# Patient Record
Sex: Female | Born: 1990 | Race: Black or African American | Hispanic: No | Marital: Single | Smoking: Former smoker
Health system: Southern US, Community
[De-identification: ages and names within clinical notes are randomized; demographics above are authoritative.]

## PROBLEM LIST (undated history)

## (undated) ENCOUNTER — Inpatient Hospital Stay (HOSPITAL_COMMUNITY): Payer: Self-pay

## (undated) DIAGNOSIS — K219 Gastro-esophageal reflux disease without esophagitis: Secondary | ICD-10-CM

## (undated) DIAGNOSIS — O98819 Other maternal infectious and parasitic diseases complicating pregnancy, unspecified trimester: Secondary | ICD-10-CM

## (undated) DIAGNOSIS — A630 Anogenital (venereal) warts: Secondary | ICD-10-CM

## (undated) DIAGNOSIS — B951 Streptococcus, group B, as the cause of diseases classified elsewhere: Secondary | ICD-10-CM

## (undated) DIAGNOSIS — B999 Unspecified infectious disease: Secondary | ICD-10-CM

## (undated) DIAGNOSIS — M419 Scoliosis, unspecified: Secondary | ICD-10-CM

## (undated) DIAGNOSIS — D649 Anemia, unspecified: Secondary | ICD-10-CM

## (undated) DIAGNOSIS — R87619 Unspecified abnormal cytological findings in specimens from cervix uteri: Secondary | ICD-10-CM

## (undated) DIAGNOSIS — A749 Chlamydial infection, unspecified: Secondary | ICD-10-CM

## (undated) DIAGNOSIS — IMO0002 Reserved for concepts with insufficient information to code with codable children: Secondary | ICD-10-CM

## (undated) HISTORY — PX: NO PAST SURGERIES: SHX2092

## (undated) HISTORY — DX: Anemia, unspecified: D64.9

## (undated) HISTORY — DX: Unspecified infectious disease: B99.9

## (undated) HISTORY — DX: Streptococcus, group b, as the cause of diseases classified elsewhere: B95.1

## (undated) HISTORY — DX: Other maternal infectious and parasitic diseases complicating pregnancy, unspecified trimester: O98.819

---
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
## 2009-07-04 ENCOUNTER — Emergency Department (HOSPITAL_COMMUNITY): Admission: EM | Admit: 2009-07-04 | Discharge: 2009-07-04 | Payer: Self-pay | Admitting: Emergency Medicine

## 2010-06-06 LAB — URINALYSIS, ROUTINE W REFLEX MICROSCOPIC
Bilirubin Urine: NEGATIVE
Glucose, UA: NEGATIVE mg/dL
Hgb urine dipstick: NEGATIVE
Ketones, ur: NEGATIVE mg/dL
Protein, ur: NEGATIVE mg/dL

## 2010-06-06 LAB — URINE CULTURE: Colony Count: 35000

## 2010-06-06 LAB — URINE MICROSCOPIC-ADD ON

## 2010-08-01 NOTE — H&P (Signed)
NAME:  Katherine Hooper, GRILLS NO.:  [id number]   MEDICAL RECORD NO.:  [id number]          PATIENT TYPE:  MAT   LOCATION:  [facility]                          FACILITY:  [facility]   PHYSICIAN:  Lendon Colonel, MD   DATE OF BIRTH:  1990/08/07   DATE OF ADMISSION:  04/02/2007  DATE OF DISCHARGE:                              HISTORY & PHYSICAL   CHIEF COMPLAINT:  Contractions, preterm cervical dilatation.   HISTORY OF PRESENT ILLNESS:  This is a 20 year old G1 at 2 weeks and 3  days who presented to the office today noting contractions for the past  several hours.  A cervical exam performed at the time showed her to be 1  cm dilated, 50% effaced.  She was placed on the tocometer and  contractions were noted.  The patient was sent to MAU for further  evaluation and workup.  In MAU the patient notes improvement in  abdominal pains and contractions after fetal movement.  No leakage of  fluid, no vaginal bleeding  no dysuria, and no change to her chronic  back pain:   PHYSICAL EXAMINATION:  VITAL SIGNS:  She was afebrile with stable vital  signs.  ABDOMEN:  Her abdomen was soft, gravid, nontender with no fundal  tenderness.  EXTREMITIES:  Her lower extremities were nontender with no edema.  GENITOURINARY:  Approximately four hours after her first exam showed her  cervix to be 1 cm dilated, 2.5 cm long, fetal vertex in the -3 position.  Her cervical consistency was soft and the position was medium on the  monitor.  She had rare contractions and the fetal heartbeat was in the  150s with good accelerations, no decelerations, 10 beats of variability.   Ultrasound was performed which showed the baby to be 2160 g with a  cervical length of 2.2.   HOSPITAL COURSE:  The patient received a dose of betamethasone, a single  dose of subcu terbutaline with decrease in the amount of contractions on  the monitor.  A urine culture was sent.  Her GBS sent from the office  was still pending.  At  time of hospital discharge her fetal fibronectin  sent from the office at time of discharge from the hospital was pending.   ASSESSMENT/PLAN:  This is a 20 year old G1 at 33 weeks and 3 days which  threatens preterm labor, minimal contractions and no change in cervical  exam over a 4-hour period.  The patient is deemed low risk for preterm  labor; however, given her gestational age, we will continue with the  second dose of betamethasone.  Tomorrow the patient is instructed on  modified bedrest for being up to two days with resumption of normal  activity.  Should the FFN come back positive we will bring the patient  back to the office tomorrow morning for repeat cervical exam and further  reassurance of low risk of threatened preterm labor.  The patient was  given labor precautions and then discharged home.      Lendon Colonel, MD  Electronically Signed     KAF/MEDQ  D:  04/02/2007  T:  04/03/2007  Job:  [id number]

## 2010-08-25 ENCOUNTER — Inpatient Hospital Stay (INDEPENDENT_AMBULATORY_CARE_PROVIDER_SITE_OTHER)
Admission: RE | Admit: 2010-08-25 | Discharge: 2010-08-25 | Disposition: A | Discharge status: Home / Self Care | Payer: Medicaid Other | Source: Ambulatory Visit | Attending: Emergency Medicine | Admitting: Emergency Medicine

## 2010-08-25 DIAGNOSIS — N912 Amenorrhea, unspecified: Secondary | ICD-10-CM

## 2010-08-25 LAB — POCT URINALYSIS DIP (DEVICE)
Bilirubin Urine: NEGATIVE
Hgb urine dipstick: NEGATIVE
Nitrite: NEGATIVE
pH: 6 (ref 5.0–8.0)

## 2010-12-07 LAB — URINALYSIS, ROUTINE W REFLEX MICROSCOPIC
Glucose, UA: NEGATIVE
Hgb urine dipstick: NEGATIVE
Protein, ur: NEGATIVE

## 2010-12-07 LAB — URINE CULTURE: Culture: NO GROWTH

## 2010-12-08 LAB — CBC
Platelets: 138 — ABNORMAL LOW
RBC: 3.64 — ABNORMAL LOW
RDW: 13.8
WBC: 12.4

## 2010-12-08 LAB — RPR: RPR Ser Ql: NONREACTIVE

## 2011-04-20 ENCOUNTER — Inpatient Hospital Stay (HOSPITAL_COMMUNITY)
Admission: AD | Admit: 2011-04-20 | Discharge: 2011-04-21 | Disposition: A | Discharge status: Home / Self Care | Payer: Medicaid Other | Source: Ambulatory Visit | Attending: Obstetrics and Gynecology | Admitting: Obstetrics and Gynecology

## 2011-04-20 DIAGNOSIS — R109 Unspecified abdominal pain: Secondary | ICD-10-CM | POA: Insufficient documentation

## 2011-04-20 DIAGNOSIS — N946 Dysmenorrhea, unspecified: Secondary | ICD-10-CM | POA: Insufficient documentation

## 2011-04-20 LAB — URINE MICROSCOPIC-ADD ON

## 2011-04-20 LAB — CBC
HCT: 39.5 % (ref 36.0–46.0)
Hemoglobin: 13.5 g/dL (ref 12.0–15.0)
RDW: 12.5 % (ref 11.5–15.5)
WBC: 4.8 10*3/uL (ref 4.0–10.5)

## 2011-04-20 LAB — ABO/RH: ABO/RH(D): B POS

## 2011-04-20 LAB — URINALYSIS, ROUTINE W REFLEX MICROSCOPIC
Glucose, UA: NEGATIVE mg/dL
Leukocytes, UA: NEGATIVE
pH: 6.5 (ref 5.0–8.0)

## 2011-04-20 LAB — WET PREP, GENITAL: Trich, Wet Prep: NONE SEEN

## 2011-04-20 NOTE — Progress Notes (Signed)
Pt states, " I have missed my period, and I started spotting yesterday and I have an occasional sharp pain in my right lower abdomen and I have a heavy feeling. Two weeks ago I had sharp pain in my right lower abdomen with sex."

## 2011-04-20 NOTE — ED Provider Notes (Signed)
History     Chief Complaint  Patient presents with  . Vaginal Bleeding  . Abdominal Cramping   HPI Katherine Hooper 21 y.o. LMP 03-12-11.  Having lower abdominal pain, began having bleeding yesterday.  Having some nausea and thinks she may be pregnant.  MAU urine pregnancy is negative.  Client reports the same thing happened when she was pregnant with her son and requests additional testing beyond urine pregnancy test.   OB History    No data available      No past medical history on file.  No past surgical history on file.  No family history on file.  History  Substance Use Topics  . Smoking status: Not on file  . Smokeless tobacco: Not on file  . Alcohol Use: Not on file    Allergies:  Allergies  Allergen Reactions  . Penicillins Hives    No prescriptions prior to admission    Review of Systems  Constitutional: Negative for fever.  Gastrointestinal: Positive for nausea and abdominal pain. Negative for vomiting.  Genitourinary:       Vaginal bleeding   Physical Exam   Blood pressure 109/63, pulse 90, temperature 98.8 F (37.1 C), temperature source Oral, resp. rate 20, last menstrual period 03/12/2011, SpO2 98.00%.  Physical Exam  Nursing note and vitals reviewed. Constitutional: She is oriented to person, place, and time. She appears well-developed and well-nourished.  HENT:  Head: Normocephalic.  Eyes: EOM are normal.  Neck: Neck supple.  GI: Soft. There is no tenderness. There is no rebound and no guarding.  Genitourinary:       Speculum exam: Vagina - mod amount of blood in vagina Cervix - No contact bleeding Bimanual exam: Cervix closed Uterus mildly tender with palpation, normal size Adnexa non tender, no masses bilaterally GC/Chlam, wet prep done Chaperone present for exam.  Musculoskeletal: Normal range of motion.  Neurological: She is alert and oriented to person, place, and time.  Skin: Skin is warm and dry.  Psychiatric: She has a  normal mood and affect.    MAU Course  Procedures  MDM Results for orders placed during the hospital encounter of 04/20/11 (from the past 24 hour(s))  URINALYSIS, ROUTINE W REFLEX MICROSCOPIC     Status: Abnormal   Collection Time   04/20/11  8:38 PM      Component Value Range   Color, Urine YELLOW  YELLOW    APPearance CLEAR  CLEAR    Specific Gravity, Urine 1.015  1.005 - 1.030    pH 6.5  5.0 - 8.0    Glucose, UA NEGATIVE  NEGATIVE (mg/dL)   Hgb urine dipstick LARGE (*) NEGATIVE    Bilirubin Urine NEGATIVE  NEGATIVE    Ketones, ur NEGATIVE  NEGATIVE (mg/dL)   Protein, ur NEGATIVE  NEGATIVE (mg/dL)   Urobilinogen, UA 0.2  0.0 - 1.0 (mg/dL)   Nitrite NEGATIVE  NEGATIVE    Leukocytes, UA NEGATIVE  NEGATIVE   URINE MICROSCOPIC-ADD ON     Status: Abnormal   Collection Time   04/20/11  8:38 PM      Component Value Range   Squamous Epithelial / LPF FEW (*) RARE    RBC / HPF TOO NUMEROUS TO COUNT  <3 (RBC/hpf)   Bacteria, UA RARE  RARE    Urine-Other MUCOUS PRESENT    WET PREP, GENITAL     Status: Abnormal   Collection Time   04/20/11 11:26 PM      Component Value Range  Yeast Wet Prep HPF POC NONE SEEN  NONE SEEN    Trich, Wet Prep NONE SEEN  NONE SEEN    Clue Cells Wet Prep HPF POC NONE SEEN  NONE SEEN    WBC, Wet Prep HPF POC FEW (*) NONE SEEN   HCG, QUANTITATIVE, PREGNANCY     Status: Normal   Collection Time   04/20/11 11:32 PM      Component Value Range   hCG, Beta Chain, Quant, S <1  <5 (mIU/mL)  CBC     Status: Normal   Collection Time   04/20/11 11:32 PM      Component Value Range   WBC 4.8  4.0 - 10.5 (K/uL)   RBC 4.45  3.87 - 5.11 (MIL/uL)   Hemoglobin 13.5  12.0 - 15.0 (g/dL)   HCT 91.4  78.2 - 95.6 (%)   MCV 88.8  78.0 - 100.0 (fL)   MCH 30.3  26.0 - 34.0 (pg)   MCHC 34.2  30.0 - 36.0 (g/dL)   RDW 21.3  08.6 - 57.8 (%)   Platelets 158  150 - 400 (K/uL)  ABO/RH     Status: Normal   Collection Time   04/20/11 11:32 PM      Component Value Range   ABO/RH(D) B  POS       Assessment and Plan  Dysmenorrhea  Plan Discussed with client that she is not pregnant. Condoms for all intercourse until you want to be pregnant. Ibuprofen 600 mg po for pain [facility]   Valery Chance 04/20/2011, 11:27 PM   Nolene Bernheim, NP 04/21/11 [id number]

## 2011-04-20 NOTE — Progress Notes (Signed)
"  I started having abd cramping and spotting yesterday.  I've never had cramping like this before.  I have more of a sharp pain on the RT side.  It is more pressure than anything.  I feel nauseated, but nothing ever comes up."

## 2011-04-21 LAB — HCG, QUANTITATIVE, PREGNANCY: hCG, Beta Chain, Quant, S: <1 m[IU]/mL (ref <5)

## 2011-04-21 MED ORDER — KETOROLAC TROMETHAMINE 60 MG/2ML IM SOLN
60.0000 mg | Freq: Once | INTRAMUSCULAR | Status: AC
  Administered 2011-04-21: 60 mg via INTRAMUSCULAR
  Filled 2011-04-21: qty 2

## 2011-04-21 MED ORDER — IBUPROFEN 600 MG PO TABS: 600.0000 mg | ORAL_TABLET | Freq: Four times a day (QID) | ORAL | Status: AC | PRN

## 2011-04-23 LAB — POCT PREGNANCY, URINE: Preg Test, Ur: NEGATIVE

## 2011-04-24 NOTE — ED Provider Notes (Signed)
Attestation of Attending Supervision of Advanced Practitioner: Evaluation and management procedures were performed by the PA/NP/CNM/OB Fellow under my supervision/collaboration. Chart reviewed and agree with management and plan.  Ilaria Much V 04/24/2011 6:54 PM    

## 2011-09-06 ENCOUNTER — Inpatient Hospital Stay (HOSPITAL_COMMUNITY)
Admission: AD | Admit: 2011-09-06 | Discharge: 2011-09-06 | Disposition: A | Discharge status: Home / Self Care | Payer: Self-pay | Source: Ambulatory Visit | Attending: Obstetrics & Gynecology | Admitting: Obstetrics & Gynecology

## 2011-09-06 ENCOUNTER — Encounter (HOSPITAL_COMMUNITY): Payer: Self-pay | Admitting: *Deleted

## 2011-09-06 DIAGNOSIS — Z3201 Encounter for pregnancy test, result positive: Secondary | ICD-10-CM | POA: Insufficient documentation

## 2011-09-06 DIAGNOSIS — Z3480 Encounter for supervision of other normal pregnancy, unspecified trimester: Secondary | ICD-10-CM

## 2011-09-06 HISTORY — DX: Chlamydial infection, unspecified: A74.9

## 2011-09-06 HISTORY — DX: Scoliosis, unspecified: M41.9

## 2011-09-06 MED ORDER — CONCEPT OB 130-92.4-1 MG PO CAPS: 1.0000 | ORAL_CAPSULE | ORAL | Status: DC

## 2011-09-06 NOTE — MAU Note (Signed)
Breasts really tender.  No period for this month.  Did a home test, needs confirmation.

## 2011-09-06 NOTE — MAU Provider Note (Signed)
Katherine Hooper y.o.G2P1001 @[redacted]w[redacted]d  by LMP No chief complaint on file.    First Provider Initiated Contact with Patient 09/06/11 1228      SUBJECTIVE  HPI: Presents for pregnancy verification. HPT positive. No pain or bleeding.  Past Medical History  Diagnosis Date  . Scoliosis   . Chlamydia    Past Surgical History  Procedure Date  . No past surgeries    History   Social History  . Marital Status: Single    Spouse Name: N/A    Number of Children: N/A  . Years of Education: N/A   Occupational History  . Not on file.   Social History Main Topics  . Smoking status: Current Everyday Smoker -- 0.2 packs/day for 10 years    Types: Cigarettes  . Smokeless tobacco: Never Used  . Alcohol Use: No  . Drug Use: No  . Sexually Active: Yes   Other Topics Concern  . Not on file   Social History Narrative  . No narrative on file   No current facility-administered medications on file prior to encounter.   No current outpatient prescriptions on file prior to encounter.   Allergies  Allergen Reactions  . Penicillins Hives    ROS: Pertinent items in HPI  OBJECTIVE Blood pressure 114/62, pulse 90, temperature 98.2 F (36.8 C), temperature source Oral, resp. rate 20, height 5' 1.5" (1.562 m), weight 80.74 kg (178 lb), last menstrual period 07/26/2011.  GENERAL: Well-developed, well-nourished female in no acute distress.  HEENT: Normocephalic, good dentition HEART: normal rate RESP: normal effort ABDOMEN: Soft, nontender EXTREMITIES: Nontender, no edema NEURO: Alert and oriented  LAB RESULTS  Results for orders placed during the hospital encounter of 09/06/11 (from the past 24 hour(s))  POCT PREGNANCY, URINE     Status: Abnormal   Collection Time   09/06/11 11:53 AM      Component Value Range   Preg Test, Ur POSITIVE (*) NEGATIVE       ASSESSMENT G2P1001 at 6wk pregnancy  PLAN Rx PNVs, preg verification letter, list of providers, precautions. Will  start [facility] asap     Katherine Hooper 09/06/2011 12:30 PM

## 2011-09-06 NOTE — Discharge Instructions (Signed)
ABCs of Pregnancy A Antepartum care is very important. Be sure you see your doctor and get prenatal care as soon as you think you are pregnant. At this time, you will be tested for infection, genetic abnormalities and potential problems with you and the pregnancy. This is the time to discuss diet, exercise, work, medications, labor, pain medication during labor and the possibility of a cesarean delivery. Ask any questions that may concern you. It is important to see your doctor regularly throughout your pregnancy. Avoid exposure to toxic substances and chemicals - such as cleaning solvents, lead and mercury, some insecticides, and paint. Pregnant women should avoid exposure to paint fumes, and fumes that cause you to feel ill, dizzy or faint. When possible, it is a good idea to have a pre-pregnancy consultation with your caregiver to begin some important recommendations your caregiver suggests such as, taking folic acid, exercising, quitting smoking, avoiding alcoholic beverages, etc. B Breastfeeding is the healthiest choice for both you and your baby. It has many nutritional benefits for the baby and health benefits for the mother. It also creates a very tight and loving bond between the baby and mother. Talk to your doctor, your family and friends, and your employer about how you choose to feed your baby and how they can support you in your decision. Not all birth defects can be prevented, but a woman can take actions that may increase her chance of having a healthy baby. Many birth defects happen very early in pregnancy, sometimes before a woman even knows she is pregnant. Birth defects or abnormalities of any child in your or the father's family should be discussed with your caregiver. Get a good support bra as your breast size changes. Wear it especially when you exercise and when nursing.  C Celebrate the news of your pregnancy with the your spouse/father and family. Childbirth classes are helpful to  take for you and the spouse/father because it helps to understand what happens during the pregnancy, labor and delivery. Cesarean delivery should be discussed with your doctor so you are prepared for that possibility. The pros and cons of circumcision if it is a boy, should be discussed with your pediatrician. Cigarette smoking during pregnancy can result in low birth weight babies. It has been associated with infertility, miscarriages, tubal pregnancies, infant death (mortality) and poor health (morbidity) in childhood. Additionally, cigarette smoking may cause long-term learning disabilities. If you smoke, you should try to quit before getting pregnant and not smoke during the pregnancy. Secondary smoke may also harm a mother and her developing baby. It is a good idea to ask people to stop smoking around you during your pregnancy and after the baby is born. Extra calcium is necessary when you are pregnant and is found in your prenatal vitamin, in dairy products, green leafy vegetables and in calcium supplements. D A healthy diet according to your current weight and height, along with vitamins and mineral supplements should be discussed with your caregiver. Domestic abuse or violence should be made known to your doctor right away to get the situation corrected. Drink more water when you exercise to keep hydrated. Discomfort of your back and legs usually develops and progresses from the middle of the second trimester through to delivery of the baby. This is because of the enlarging baby and uterus, which may also affect your balance. Do not take illegal drugs. Illegal drugs can seriously harm the baby and you. Drink extra fluids (water is best) throughout pregnancy to help   your body keep up with the increases in your blood volume. Drink at least 6 to 8 glasses of water, fruit juice, or milk each day. A good way to know you are drinking enough fluid is when your urine looks almost like clear water or is very light  yellow.  E Eat healthy to get the nutrients you and your unborn baby need. Your meals should include the five basic food groups. Exercise (30 minutes of light to moderate exercise a day) is important and encouraged during pregnancy, if there are no medical problems or problems with the pregnancy. Exercise that causes discomfort or dizziness should be stopped and reported to your caregiver. Emotions during pregnancy can change from being ecstatic to depression and should be understood by you, your partner and your family. F Fetal screening with ultrasound, amniocentesis and monitoring during pregnancy and labor is common and sometimes necessary. Take 400 micrograms of folic acid daily both before, when possible, and during the first few months of pregnancy to reduce the risk of birth defects of the brain and spine. All women who could possibly become pregnant should take a vitamin with folic acid, every day. It is also important to eat a healthy diet with fortified foods (enriched grain products, including cereals, rice, breads, and pastas) and foods with natural sources of folate (orange juice, green leafy vegetables, beans, peanuts, broccoli, asparagus, peas, and lentils). The father should be involved with all aspects of the pregnancy including, the prenatal care, childbirth classes, labor, delivery, and postpartum time. Fathers may also have emotional concerns about being a father, financial needs, and raising a family. G Genetic testing should be done appropriately. It is important to know your family and the father's history. If there have been problems with pregnancies or birth defects in your family, report these to your doctor. Also, genetic counselors can talk with you about the information you might need in making decisions about having a family. You can call a major medical center in your area for help in finding a board-certified genetic counselor. Genetic testing and counseling should be done  before pregnancy when possible, especially if there is a history of problems in the mother's or father's family. Certain ethnic backgrounds are more at risk for genetic defects. H Get familiar with the hospital where you will be having your baby. Get to know how long it takes to get there, the labor and delivery area, and the hospital procedures. Be sure your medical insurance is accepted there. Get your home ready for the baby including, clothes, the baby's room (when possible), furniture and car seat. Hand washing is important throughout the day, especially after handling raw meat and poultry, changing the baby's diaper or using the bathroom. This can help prevent the spread of many bacteria and viruses that cause infection. Your hair may become dry and thinner, but will return to normal a few weeks after the baby is born. Heartburn is a common problem that can be treated by taking antacids recommended by your caregiver, eating smaller meals 5 or 6 times a day, not drinking liquids when eating, drinking between meals and raising the head of your bed 2 to 3 inches. I Insurance to cover you, the baby, doctor and hospital should be reviewed so that you will be prepared to pay any costs not covered by your insurance plan. If you do not have medical insurance, there are usually clinics and services available for you in your community. Take 30 milligrams of iron during   your pregnancy as prescribed by your doctor to reduce the risk of low red blood cells (anemia) later in pregnancy. All women of childbearing age should eat a diet rich in iron. J There should be a joint effort for the mother, father and any other children to adapt to the pregnancy financially, emotionally, and psychologically during the pregnancy. Join a support group for moms-to-be. Or, join a class on parenting or childbirth. Have the family participate when possible. K Know your limits. Let your caregiver know if you experience any of the  following:   Pain of any kind.   Strong cramps.   You develop a lot of weight in a short period of time (5 pounds in 3 to 5 days).   Vaginal bleeding, leaking of amniotic fluid.   Headache, vision problems.   Dizziness, fainting, shortness of breath.   Chest pain.   Fever of 102 F (38.9 C) or higher.   Gush of clear fluid from your vagina.   Painful urination.   Domestic violence.   Irregular heartbeat (palpitations).   Rapid beating of the heart (tachycardia).   Constant feeling sick to your stomach (nauseous) and vomiting.   Trouble walking, fluid retention (edema).   Muscle weakness.   If your baby has decreased activity.   Persistent diarrhea.   Abnormal vaginal discharge.   Uterine contractions at 20-minute intervals.   Back pain that travels down your leg.  L Learn and practice that what you eat and drink should be in moderation and healthy for you and your baby. Legal drugs such as alcohol and caffeine are important issues for pregnant women. There is no safe amount of alcohol a woman can drink while pregnant. Fetal alcohol syndrome, a disorder characterized by growth retardation, facial abnormalities, and central nervous system dysfunction, is caused by a woman's use of alcohol during pregnancy. Caffeine, found in tea, coffee, soft drinks and chocolate, should also be limited. Be sure to read labels when trying to cut down on caffeine during pregnancy. More than 200 foods, beverages, and over-the-counter medications contain caffeine and have a high salt content! There are coffees and teas that do not contain caffeine. M Medical conditions such as diabetes, epilepsy, and high blood pressure should be treated and kept under control before pregnancy when possible, but especially during pregnancy. Ask your caregiver about any medications that may need to be changed or adjusted during pregnancy. If you are currently taking any medications, ask your caregiver if it  is safe to take them while you are pregnant or before getting pregnant when possible. Also, be sure to discuss any herbs or vitamins you are taking. They are medicines, too! Discuss with your doctor all medications, prescribed and over-the-counter, that you are taking. During your prenatal visit, discuss the medications your doctor may give you during labor and delivery. N Never be afraid to ask your doctor or caregiver questions about your health, the progress of the pregnancy, family problems, stressful situations, and recommendation for a pediatrician, if you do not have one. It is better to take all precautions and discuss any questions or concerns you may have during your office visits. It is a good idea to write down your questions before you visit the doctor. O Over-the-counter cough and cold remedies may contain alcohol or other ingredients that should be avoided during pregnancy. Ask your caregiver about prescription, herbs or over-the-counter medications that you are taking or may consider taking while pregnant.  P Physical activity during pregnancy can   benefit both you and your baby by lessening discomfort and fatigue, providing a sense of well-being, and increasing the likelihood of early recovery after delivery. Light to moderate exercise during pregnancy strengthens the belly (abdominal) and back muscles. This helps improve posture. Practicing yoga, walking, swimming, and cycling on a stationary bicycle are usually safe exercises for pregnant women. Avoid scuba diving, exercise at high altitudes (over 3000 feet), skiing, horseback riding, contact sports, etc. Always check with your doctor before beginning any kind of exercise, especially during pregnancy and especially if you did not exercise before getting pregnant. Q Queasiness, stomach upset and morning sickness are common during pregnancy. Eating a couple of crackers or dry toast before getting out of bed. Foods that you normally love may  make you feel sick to your stomach. You may need to substitute other nutritious foods. Eating 5 or 6 small meals a day instead of 3 large ones may make you feel better. Do not drink with your meals, drink between meals. Questions that you have should be written down and asked during your prenatal visits. R Read about and make plans to baby-proof your home. There are important tips for making your home a safer environment for your baby. Review the tips and make your home safer for you and your baby. Read food labels regarding calories, salt and fat content in the food. S Saunas, hot tubs, and steam rooms should be avoided while you are pregnant. Excessive high heat may be harmful during your pregnancy. Your caregiver will screen and examine you for sexually transmitted diseases and genetic disorders during your prenatal visits. Learn the signs of labor. Sexual relations while pregnant is safe unless there is a medical or pregnancy problem and your caregiver advises against it. T Traveling long distances should be avoided especially in the third trimester of your pregnancy. If you do have to travel out of state, be sure to take a copy of your medical records and medical insurance plan with you. You should not travel long distances without seeing your doctor first. Most airlines will not allow you to travel after 36 weeks of pregnancy. Toxoplasmosis is an infection caused by a parasite that can seriously harm an unborn baby. Avoid eating undercooked meat and handling cat litter. Be sure to wear gloves when gardening. Tingling of the hands and fingers is not unusual and is due to fluid retention. This will go away after the baby is born. U Womb (uterus) size increases during the first trimester. Your kidneys will begin to function more efficiently. This may cause you to feel the need to urinate more often. You may also leak urine when sneezing, coughing or laughing. This is due to the growing uterus pressing  against your bladder, which lies directly in front of and slightly under the uterus during the first few months of pregnancy. If you experience burning along with frequency of urination or bloody urine, be sure to tell your doctor. The size of your uterus in the third trimester may cause a problem with your balance. It is advisable to maintain good posture and avoid wearing high heels during this time. An ultrasound of your baby may be necessary during your pregnancy and is safe for you and your baby. V Vaccinations are an important concern for pregnant women. Get needed vaccines before pregnancy. Center for Disease Control (www.cdc.gov) has clear guidelines for the use of vaccines during pregnancy. Review the list, be sure to discuss it with your doctor. Prenatal vitamins are helpful   and healthy for you and the baby. Do not take extra vitamins except what is recommended. Taking too much of certain vitamins can cause overdose problems. Continuous vomiting should be reported to your caregiver. Varicose veins may appear especially if there is a family history of varicose veins. They should subside after the delivery of the baby. Support hose helps if there is leg discomfort. W Being overweight or underweight during pregnancy may cause problems. Try to get within 15 pounds of your ideal weight before pregnancy. Remember, pregnancy is not a time to be dieting! Do not stop eating or start skipping meals as your weight increases. Both you and your baby need the calories and nutrition you receive from a healthy diet. Be sure to consult with your doctor about your diet. There is a formula and diet plan available depending on whether you are overweight or underweight. Your caregiver or nutritionist can help and advise you if necessary. X Avoid X-rays. If you must have dental work or diagnostic tests, tell your dentist or physician that you are pregnant so that extra care can be taken. X-rays should only be taken when  the risks of not taking them outweigh the risk of taking them. If needed, only the minimum amount of radiation should be used. When X-rays are necessary, protective lead shields should be used to cover areas of the body that are not being X-rayed. Y Your baby loves you. Breastfeeding your baby creates a loving and very close bond between the two of you. Give your baby a healthy environment to live in while you are pregnant. Infants and children require constant care and guidance. Their health and safety should be carefully watched at all times. After the baby is born, rest or take a nap when the baby is sleeping. Z Get your ZZZs. Be sure to get plenty of rest. Resting on your side as often as possible, especially on your left side is advised. It provides the best circulation to your baby and helps reduce swelling. Try taking a nap for 30 to 45 minutes in the afternoon when possible. After the baby is born rest or take a nap when the baby is sleeping. Try elevating your feet for that amount of time when possible. It helps the circulation in your legs and helps reduce swelling.  Most information courtesy of the CDC. Document Released: 03/05/2005 Document Revised: 02/22/2011 Document Reviewed: 11/17/2008 [employer] Patient Information 2012 [employer]. 

## 2011-11-07 ENCOUNTER — Inpatient Hospital Stay (HOSPITAL_COMMUNITY)
Admission: AD | Admit: 2011-11-07 | Discharge: 2011-11-07 | Disposition: A | Discharge status: Home / Self Care | Payer: Medicaid Other | Source: Ambulatory Visit | Attending: Obstetrics & Gynecology | Admitting: Obstetrics & Gynecology

## 2011-11-07 ENCOUNTER — Encounter (HOSPITAL_COMMUNITY): Payer: Self-pay | Admitting: *Deleted

## 2011-11-07 ENCOUNTER — Inpatient Hospital Stay (HOSPITAL_COMMUNITY): Payer: Medicaid Other

## 2011-11-07 DIAGNOSIS — O209 Hemorrhage in early pregnancy, unspecified: Secondary | ICD-10-CM | POA: Insufficient documentation

## 2011-11-07 DIAGNOSIS — N760 Acute vaginitis: Secondary | ICD-10-CM | POA: Insufficient documentation

## 2011-11-07 DIAGNOSIS — B9689 Other specified bacterial agents as the cause of diseases classified elsewhere: Secondary | ICD-10-CM | POA: Insufficient documentation

## 2011-11-07 DIAGNOSIS — R109 Unspecified abdominal pain: Secondary | ICD-10-CM | POA: Insufficient documentation

## 2011-11-07 DIAGNOSIS — A499 Bacterial infection, unspecified: Secondary | ICD-10-CM | POA: Insufficient documentation

## 2011-11-07 DIAGNOSIS — O2390 Unspecified genitourinary tract infection in pregnancy, unspecified trimester: Secondary | ICD-10-CM | POA: Insufficient documentation

## 2011-11-07 LAB — WET PREP, GENITAL: Yeast Wet Prep HPF POC: NONE SEEN

## 2011-11-07 LAB — URINALYSIS, ROUTINE W REFLEX MICROSCOPIC
Bilirubin Urine: NEGATIVE
Nitrite: NEGATIVE
Specific Gravity, Urine: 1.02 (ref 1.005–1.030)
Urobilinogen, UA: 0.2 mg/dL (ref 0.0–1.0)
pH: 8 (ref 5.0–8.0)

## 2011-11-07 LAB — URINE MICROSCOPIC-ADD ON

## 2011-11-07 MED ORDER — METRONIDAZOLE 500 MG PO TABS: 500.0000 mg | ORAL_TABLET | Freq: Two times a day (BID) | ORAL | Status: AC

## 2011-11-07 NOTE — MAU Provider Note (Signed)
History     CSN: [id number]  Arrival date and time: 11/07/11 1206   None     Chief Complaint  Patient presents with  . Abdominal Pain  . Vaginal Bleeding   Abdominal Pain This is a new problem. The current episode started 1 to 4 weeks ago. The onset quality is undetermined. The problem occurs 2 to 4 times per day. The problem has been gradually worsening. The pain is located in the suprapubic region. The quality of the pain is sharp. The abdominal pain does not radiate. Associated symptoms include nausea and vomiting. Pertinent negatives include no constipation, diarrhea, dysuria, fever or frequency. The pain is aggravated by certain positions. The pain is relieved by certain positions. She has tried nothing for the symptoms.  Vaginal Bleeding Associated symptoms include abdominal pain, nausea and vomiting. Pertinent negatives include no chills, constipation, diarrhea, dysuria, fever or frequency.    Pt G2 P1, at 14w 2d, with no prenatal care, states that she has been having lower abd pain on and off for the last few weeks. She states that suddenly got a lot worse last night when she went to stand up from a chair. She is in no pain now. She became worried after seeing brown blood in her underwear this morning. States when she goes to the bathroom there is a little pink blood on the tissue. Denies headache, fever, chills, diarrhea, and vaginal discharge. Has had some nausea and vomiting since pregnancy started.   OB History    Grav Para Term Preterm Abortions TAB SAB Ect Mult Living   2 1 1  0 0 0 0 0 0 1      Past Medical History  Diagnosis Date  . Scoliosis   . Chlamydia     Past Surgical History  Procedure Date  . No past surgeries     Family History  Problem Relation Age of Onset  . Other Neg Hx     History  Substance Use Topics  . Smoking status: Former Smoker -- 0.2 packs/day for 10 years    Types: Cigarettes    Quit date: 09/17/2011  . Smokeless tobacco: Never  Used  . Alcohol Use: No    Allergies:  Allergies  Allergen Reactions  . Penicillins Hives    Prescriptions prior to admission  Medication Sig Dispense Refill  . Prenat w/o A Vit-FeFum-FePo-FA (CONCEPT OB) 130-92.4-1 MG CAPS Take 1 tablet by mouth 1 day or 1 dose.  30 capsule  5    Review of Systems  Constitutional: Negative for fever and chills.  Gastrointestinal: Positive for nausea, vomiting and abdominal pain. Negative for diarrhea and constipation.       Has had nausea and vomiting since pregnancy started. Is unchanged today  Genitourinary: Positive for vaginal bleeding. Negative for dysuria and frequency.   Physical Exam   Blood pressure 82/70, pulse 93, temperature 98.3 F (36.8 C), temperature source Oral, resp. rate 16, height 5' 1.5" (1.562 m), weight 83.915 kg (185 lb), last menstrual period 07/26/2011, SpO2 98.00%.  Physical Exam  Constitutional: She is oriented to person, place, and time. She appears well-developed and well-nourished. No distress.  HENT:  Head: Normocephalic and atraumatic.  Eyes: EOM are normal.  Cardiovascular: Normal rate, regular rhythm and normal heart sounds.   Respiratory: Effort normal.  GI: Soft. She exhibits no distension. There is tenderness. There is no rebound and no guarding.       Mild suprapubic tenderness on exam  Genitourinary: Vaginal discharge found.  There is a moderate amount of bloody discharge in the vaginal vault and vagina. Cervix is closed. No cervical motion tenderness. No adnexal tenderness.   Neurological: She is alert and oriented to person, place, and time.  Skin: Skin is warm and dry.    MAU Course  Procedures  Results for orders placed during the hospital encounter of 11/07/11 (from the past 24 hour(s))  URINALYSIS, ROUTINE W REFLEX MICROSCOPIC     Status: Abnormal   Collection Time   11/07/11 12:22 PM      Component Value Range   Color, Urine YELLOW  YELLOW   APPearance CLEAR  CLEAR   Specific  Gravity, Urine 1.020  1.005 - 1.030   pH 8.0  5.0 - 8.0   Glucose, UA NEGATIVE  NEGATIVE mg/dL   Hgb urine dipstick MODERATE (*) NEGATIVE   Bilirubin Urine NEGATIVE  NEGATIVE   Ketones, ur NEGATIVE  NEGATIVE mg/dL   Protein, ur NEGATIVE  NEGATIVE mg/dL   Urobilinogen, UA 0.2  0.0 - 1.0 mg/dL   Nitrite NEGATIVE  NEGATIVE   Leukocytes, UA NEGATIVE  NEGATIVE  URINE MICROSCOPIC-ADD ON     Status: Abnormal   Collection Time   11/07/11 12:22 PM      Component Value Range   Squamous Epithelial / LPF FEW (*) RARE   RBC / HPF 0-2  <3 RBC/hpf  WET PREP, GENITAL     Status: Abnormal   Collection Time   11/07/11  2:35 PM      Component Value Range   Yeast Wet Prep HPF POC NONE SEEN  NONE SEEN   Trich, Wet Prep NONE SEEN  NONE SEEN   Clue Cells Wet Prep HPF POC MODERATE (*) NONE SEEN   WBC, Wet Prep HPF POC FEW (*) NONE SEEN     Assessment and Plan  Abd pain and bleeding in the 1st trimester- pain is most likely due to round ligament pain. Urinalysis was wnl. Will get C&G, wet prep, and ultrasound.  Ultrasound was wnl. Wet prep showed bacterial vaginosis- will treat with flagyl. C&G pending.  Final DX: Round ligament pain and first trimester bleeding  Pt encouraged to seek prenatal care. States understanding.  Aniceto Boss, MA, PA-S2  FORTH, VICTORIA 11/07/2011, 1:02 PM   15:47  Call from [place], [place], she and the resident are in a delivery.  All labs back and patient may be discharged.  Asked that I discharge patient.

## 2011-11-07 NOTE — MAU Note (Signed)
Patient states she started having lower abdominal pain last night. Today had a dark red/brownish discharge with wiping. Intercourse last 2 days ago.

## 2011-11-10 NOTE — MAU Provider Note (Signed)
I examined pt and agree with documentation above and resident plan of care, with the exception of diagnosis changed to vaginal bleeding in 2nd trimester. [facility]

## 2011-12-30 ENCOUNTER — Inpatient Hospital Stay (HOSPITAL_COMMUNITY): Payer: Medicaid Other

## 2011-12-30 ENCOUNTER — Encounter (HOSPITAL_COMMUNITY): Payer: Self-pay | Admitting: Obstetrics and Gynecology

## 2011-12-30 ENCOUNTER — Inpatient Hospital Stay (HOSPITAL_COMMUNITY)
Admission: AD | Admit: 2011-12-30 | Discharge: 2011-12-30 | Disposition: A | Discharge status: Home / Self Care | Payer: Medicaid Other | Source: Ambulatory Visit | Attending: Obstetrics and Gynecology | Admitting: Obstetrics and Gynecology

## 2011-12-30 DIAGNOSIS — A499 Bacterial infection, unspecified: Secondary | ICD-10-CM

## 2011-12-30 DIAGNOSIS — O4692 Antepartum hemorrhage, unspecified, second trimester: Secondary | ICD-10-CM

## 2011-12-30 DIAGNOSIS — R109 Unspecified abdominal pain: Secondary | ICD-10-CM | POA: Insufficient documentation

## 2011-12-30 DIAGNOSIS — B9689 Other specified bacterial agents as the cause of diseases classified elsewhere: Secondary | ICD-10-CM | POA: Insufficient documentation

## 2011-12-30 DIAGNOSIS — N760 Acute vaginitis: Secondary | ICD-10-CM | POA: Insufficient documentation

## 2011-12-30 DIAGNOSIS — O209 Hemorrhage in early pregnancy, unspecified: Secondary | ICD-10-CM | POA: Insufficient documentation

## 2011-12-30 DIAGNOSIS — O2390 Unspecified genitourinary tract infection in pregnancy, unspecified trimester: Secondary | ICD-10-CM | POA: Insufficient documentation

## 2011-12-30 LAB — URINALYSIS, ROUTINE W REFLEX MICROSCOPIC
Glucose, UA: NEGATIVE mg/dL
Hgb urine dipstick: NEGATIVE
Leukocytes, UA: NEGATIVE
pH: 6.5 (ref 5.0–8.0)

## 2011-12-30 LAB — WET PREP, GENITAL: Yeast Wet Prep HPF POC: NONE SEEN

## 2011-12-30 MED ORDER — METRONIDAZOLE 500 MG PO TABS: 500.0000 mg | ORAL_TABLET | Freq: Two times a day (BID) | ORAL | Status: DC

## 2011-12-30 NOTE — MAU Note (Signed)
Patient states she is having lower abdominal sharp pain and spotting.

## 2011-12-30 NOTE — MAU Provider Note (Signed)
Chief Complaint:  Abdominal Pain and Vaginal Bleeding   First Provider Initiated Contact with Patient 12/30/11 0910      HPI: Katherine Hooper is a 21 y.o. G2P1001 at 21w3dwho presents to maternity admissions reporting low abd cramping, LBP and spotting since last night. Bleeding has stopped, but cramping has worsened. This morning. Denies fever, chills, urinary complaints, GI complaints, LOF, vaginal discharge. Good fetal movement. Has not started prenatal care. States she has NOB 01/02/12 at [facility]. One prior episode of spotting at 14 weeks. No previa or abruption identified. Last IC >24 hours ago.   Past Medical History: Past Medical History  Diagnosis Date  . Scoliosis   . Chlamydia     Past obstetric history: OB History    Grav Para Term Preterm Abortions TAB SAB Ect Mult Living   2 1 1  0 0 0 0 0 0 1     # Outc Date GA Lbr Len/2nd Wgt Sex Del Anes PTL Lv   1 TRM      SVD EPI No    2 CUR               Past Surgical History: Past Surgical History  Procedure Date  . No past surgeries     Family History: Family History  Problem Relation Age of Onset  . Other Neg Hx     Social History: History  Substance Use Topics  . Smoking status: Former Smoker -- 0.2 packs/day for 10 years    Types: Cigarettes    Quit date: 09/17/2011  . Smokeless tobacco: Never Used  . Alcohol Use: No    Allergies:  Allergies  Allergen Reactions  . Penicillins Hives    Meds:  No prescriptions prior to admission    ROS: Pertinent findings in history of present illness.  Physical Exam  Blood pressure 118/62, pulse 92, temperature 97.3 F (36.3 C), temperature source Oral, resp. rate 18, height 5\' 2"  (1.575 m), weight 87.454 kg (192 lb 12.8 oz), last menstrual period 07/26/2011. GENERAL: Well-developed, well-nourished female in no acute distress.  HEENT: normocephalic HEART: normal rate RESP: normal effort ABDOMEN: Soft, non-tender, gravid appropriate for gestational  age EXTREMITIES: Nontender, no edema NEURO: alert and oriented SPECULUM EXAM: NEFG, moderate amount of thick, white, odorless discharge, no blood, cervix clean Dilation: FT ext os, closed internal os. Effacement (%): Thick Cervical Position: Posterior Presentation: Undeterminable Exam by:: Ivonne Andrew CNM  FHT:  [id number]   Labs: Results for orders placed during the hospital encounter of 12/30/11 (from the past 24 hour(s))  URINALYSIS, ROUTINE W REFLEX MICROSCOPIC     Status: Abnormal   Collection Time   12/30/11  7:30 AM      Component Value Range   Color, Urine YELLOW  YELLOW   APPearance HAZY (*) CLEAR   Specific Gravity, Urine 1.025  1.005 - 1.030   pH 6.5  5.0 - 8.0   Glucose, UA NEGATIVE  NEGATIVE mg/dL   Hgb urine dipstick NEGATIVE  NEGATIVE   Bilirubin Urine NEGATIVE  NEGATIVE   Ketones, ur NEGATIVE  NEGATIVE mg/dL   Protein, ur NEGATIVE  NEGATIVE mg/dL   Urobilinogen, UA 0.2  0.0 - 1.0 mg/dL   Nitrite NEGATIVE  NEGATIVE   Leukocytes, UA NEGATIVE  NEGATIVE  WET PREP, GENITAL     Status: Abnormal   Collection Time   12/30/11  9:20 AM      Component Value Range   Yeast Wet Prep HPF POC NONE SEEN  NONE SEEN  Trich, Wet Prep NONE SEEN  NONE SEEN   Clue Cells Wet Prep HPF POC MODERATE (*) NONE SEEN   WBC, Wet Prep HPF POC FEW (*) NONE SEEN  FETAL FIBRONECTIN     Status: Normal   Collection Time   12/30/11  9:20 AM      Component Value Range   Fetal Fibronectin NEGATIVE  NEGATIVE    Imaging:   ED Course   Assessment: 1. BV (bacterial vaginosis)   2. Second trimester bleeding    Plan: Discharge home Preterm labor precautions and fetal kick counts.  Bleeding precautions Pelvic rest x 1 week.     Follow-up Information    Follow up with [facility]. On 01/02/2012.   Contact information:   [address] [place] [address] [phone]      Follow up with [facility] [place]  MATERNITY ADMISSIONS. (As needed if symptoms worsen)    Contact information:   [address] [id number] mc [place] [address] [phone]          Medication List     As of 12/30/2011  9:33 PM    TAKE these medications         acetaminophen 325 MG tablet   Commonly known as: TYLENOL   Take 650 mg by mouth daily as needed. For pain      metroNIDAZOLE 500 MG tablet   Commonly known as: FLAGYL   Take 1 tablet (500 mg total) by mouth 2 (two) times daily.      prenatal multivitamin Tabs   Take 1 tablet by mouth daily.         [place], CNM 12/30/2011 5:40 PM

## 2011-12-30 NOTE — MAU Note (Signed)
Pt presents to MAU with chief complaint of vaginal spotting and lower abdominal pain. Pt says she felt a sharp pain in her lower abdomen this morning and when she went to the bathroom she noticed dark brown blood. Pt is a G2P1 @ [redacted]w[redacted]d.

## 2011-12-31 LAB — GC/CHLAMYDIA PROBE AMP, GENITAL: GC Probe Amp, Genital: NEGATIVE

## 2012-01-01 NOTE — MAU Provider Note (Signed)
Attestation of Attending Supervision of Advanced Practitioner: Evaluation and management procedures were performed by the PA/NP/CNM/OB Fellow under my supervision/collaboration. Chart reviewed and agree with management and plan.  Mina Babula V 01/01/2012 8:06 AM    

## 2012-01-02 ENCOUNTER — Ambulatory Visit (INDEPENDENT_AMBULATORY_CARE_PROVIDER_SITE_OTHER): Payer: Medicaid Other | Admitting: Obstetrics and Gynecology

## 2012-01-02 DIAGNOSIS — Z331 Pregnant state, incidental: Secondary | ICD-10-CM

## 2012-01-02 LAB — OB RESULTS CONSOLE GBS: GBS: POSITIVE

## 2012-01-03 ENCOUNTER — Other Ambulatory Visit: Payer: Self-pay | Admitting: Obstetrics and Gynecology

## 2012-01-03 DIAGNOSIS — Z3689 Encounter for other specified antenatal screening: Secondary | ICD-10-CM

## 2012-01-03 LAB — PRENATAL PANEL VII
Antibody Screen: NEGATIVE
Basophils Absolute: 0 10*3/uL (ref 0.0–0.1)
Basophils Relative: 0 % (ref 0–1)
Eosinophils Absolute: 0.1 10*3/uL (ref 0.0–0.7)
Eosinophils Relative: 2 % (ref 0–5)
HCT: 36.2 % (ref 36.0–46.0)
MCH: 31 pg (ref 26.0–34.0)
MCHC: 35.4 g/dL (ref 30.0–36.0)
MCV: 87.7 fL (ref 78.0–100.0)
Monocytes Absolute: 0.5 10*3/uL (ref 0.1–1.0)
Platelets: 175 10*3/uL (ref 150–400)
RDW: 12.9 % (ref 11.5–15.5)
Rh Type: POSITIVE

## 2012-01-03 LAB — POCT URINALYSIS DIPSTICK
Bilirubin, UA: 1
Glucose, UA: NEGATIVE
Nitrite, UA: NEGATIVE
Spec Grav, UA: 1.015
Urobilinogen, UA: NEGATIVE

## 2012-01-03 NOTE — Progress Notes (Signed)
NOB interview completed.  Pt is late to care.  Has been seen @ [facility] for vaginal bldg on different occasions.  Is currently on Flagyl for BV dx'd by [facility].  Pt has not had an anatomy scan yet.  Pt scheduled for anatomy on Monday 01/07/12 @ 1030, then NOB work up @ 1130 w/ MK.

## 2012-01-04 LAB — HEMOGLOBINOPATHY EVALUATION
Hgb A: 97.4 % (ref 96.8–97.8)
Hgb F Quant: 0 % (ref 0.0–2.0)

## 2012-01-07 ENCOUNTER — Encounter: Payer: Self-pay | Admitting: Obstetrics and Gynecology

## 2012-01-07 ENCOUNTER — Ambulatory Visit (INDEPENDENT_AMBULATORY_CARE_PROVIDER_SITE_OTHER): Payer: Medicaid Other

## 2012-01-07 ENCOUNTER — Ambulatory Visit (INDEPENDENT_AMBULATORY_CARE_PROVIDER_SITE_OTHER): Payer: Medicaid Other | Admitting: Obstetrics and Gynecology

## 2012-01-07 VITALS — BP 100/54 | Wt 193.0 lb

## 2012-01-07 DIAGNOSIS — N390 Urinary tract infection, site not specified: Secondary | ICD-10-CM

## 2012-01-07 DIAGNOSIS — O2340 Unspecified infection of urinary tract in pregnancy, unspecified trimester: Secondary | ICD-10-CM

## 2012-01-07 DIAGNOSIS — B951 Streptococcus, group B, as the cause of diseases classified elsewhere: Secondary | ICD-10-CM

## 2012-01-07 DIAGNOSIS — Z3689 Encounter for other specified antenatal screening: Secondary | ICD-10-CM

## 2012-01-07 DIAGNOSIS — Z331 Pregnant state, incidental: Secondary | ICD-10-CM

## 2012-01-07 DIAGNOSIS — O2390 Unspecified genitourinary tract infection in pregnancy, unspecified trimester: Secondary | ICD-10-CM

## 2012-01-07 LAB — US OB COMP + 14 WK

## 2012-01-07 LAB — POCT URINALYSIS DIPSTICK
Bilirubin, UA: NEGATIVE
Blood, UA: NEGATIVE
Glucose, UA: NEGATIVE
Spec Grav, UA: 1.02

## 2012-01-07 NOTE — Progress Notes (Signed)
Pt is here today for nob work-up .

## 2012-01-08 ENCOUNTER — Telehealth: Payer: Self-pay | Admitting: Obstetrics and Gynecology

## 2012-01-08 LAB — PAP IG W/ RFLX HPV ASCU

## 2012-01-08 LAB — CULTURE, OB URINE: Colony Count: 30000

## 2012-01-08 NOTE — Telephone Encounter (Signed)
Discussed UA C&S for GBS+ urine result with PCN allergy hives, reaction hives delayed 2-3 days after start of PCN dosing discussed plan for RX Keflex, discussed risk of reaction if rash or hives, short of breath reviewd symptoms call office to ER if sx indicate discussed, f/o 1 week UA C&S, plan tx for GBS during labor. Verbalized understanding. Start Keflex in am so alert and ready to report if symptoms, 8 water daily, verbalized understanding. Discussion with Dr. Stefano Gaul per telephone as written.

## 2012-01-08 NOTE — Progress Notes (Signed)
Patient ID: Katherine Hooper, female   DOB: 17-Jan-1991, 21 y.o.   MRN: [id number] Katherine Hooper is a 21 y.o. female presenting for new ob visit. Certain of LMP with [facility] 2/13 for dating, no on birth control at time of conception, viability [place] 8/21 at 14 2/7 weeks with [facility] 2/17 agrees. Tolerating PNV, with vomiting about 3 x weekly. Works at [employer]. @MED  @IPIL[employer] @ OB History    Grav Para Term Preterm Abortions TAB SAB Ect Mult Living   2 1 1  0 0 0 0 0 0 1     Past Medical History  Diagnosis Date  . Scoliosis   . Chlamydia   . Infection     BV x 2;currently on Flagyl for BV  . Group B streptococcal infection in pregnancy 2009  . Anemia     during last pregnancy   Past Surgical History  Procedure Date  . No past surgeries    Family History: family history includes Asthma in her cousin and maternal grandmother; Diabetes in her maternal grandfather and maternal grandmother; Hypertension in her maternal grandfather, maternal grandmother, paternal grandfather, and paternal grandmother; Kidney disease in her maternal grandmother; and Stroke in her maternal grandfather.  There is no history of Other. Social History:  reports that she quit smoking about 3 months ago. Her smoking use included Cigarettes. She has a 2.5 pack-year smoking history. She has never used smokeless tobacco. She reports that she drinks alcohol. She reports that she does not use illicit drugs.  @ROS @    Blood pressure 100/54, weight 193 lb (87.544 kg), last menstrual period 07/26/2011. Physical exam: Calm, no distress, HEENT wnl lungs clear bilaterally, breasts bilaterally no masses, dimpling, or drainage, AP RRR, abd soft, gravid, nt, bowel sounds active, abdomen nontender,  Normal hair distrubition mons pubis,  EGBUS WNL, sterile speculum exam,  vagina pink, moist normal rugae,  cerix LTC, no cervical motion tenderness, No adnexal masses or tenderness Uterus size 23 cm Scant white discharge DTR + 1 no  clonus No edema to lower extremities US anatomy today 23 1/7 weeks with anatomy WNL, posterior placenta, cervix 3/72  Prenatal labs: ABO, Rh: B/POS/-- (10/16 1540) Antibody: NEG (10/16 1540) Rubella:  Immune RPR: NON REAC (10/16 1540)  HBsAg: NEGATIVE (10/16 1540)  HIV: NON REACTIVE (10/16 1540)    Assessment/Plan: [redacted]w[redacted]d GBS positive urine needs sensativities, plan TX in labor GC/CHL neg/neg WET PREP PAP pending transfer of records ULTRASOUND WNL anatomy today Genetic testing declined Collaboration with Dr. Estanislado Pandy. [facility], Katherine Hooper 01/08/2012, 3:21 PM Lavera Guise, CNM

## 2012-01-08 NOTE — Progress Notes (Signed)
Patient ID: Katherine Hooper, female   DOB: 1991-01-17, 21 y.o.   MRN: [id number] Discussed UA C&S for GBS+ urine result with PCN allergy hives, reaction hives delayed 2-3 days after start of PCN dosing discussed plan for RX Keflex, discussed risk of reaction if rash or hives, short of breath reviewd symptoms call office to ER if sx indicate discussed, f/o 1 week UA C&S, plan tx for GBS during labor. Verbalized understanding. Start Keflex in am so alert and ready to report if symptoms, 8 water daily, verbalized understanding. Discussion with Dr. Stefano Gaul per telephone as written. Lavera Guise, CNM

## 2012-01-09 ENCOUNTER — Encounter: Payer: Self-pay | Admitting: Obstetrics and Gynecology

## 2012-01-09 DIAGNOSIS — Z880 Allergy status to penicillin: Secondary | ICD-10-CM | POA: Insufficient documentation

## 2012-01-09 DIAGNOSIS — O4690 Antepartum hemorrhage, unspecified, unspecified trimester: Secondary | ICD-10-CM | POA: Insufficient documentation

## 2012-01-10 ENCOUNTER — Encounter: Payer: Self-pay | Admitting: Obstetrics and Gynecology

## 2012-01-10 DIAGNOSIS — O2340 Unspecified infection of urinary tract in pregnancy, unspecified trimester: Secondary | ICD-10-CM

## 2012-01-10 DIAGNOSIS — B951 Streptococcus, group B, as the cause of diseases classified elsewhere: Secondary | ICD-10-CM | POA: Insufficient documentation

## 2012-01-11 ENCOUNTER — Encounter: Payer: Self-pay | Admitting: Obstetrics and Gynecology

## 2012-01-11 ENCOUNTER — Telehealth: Payer: Self-pay | Admitting: Obstetrics and Gynecology

## 2012-01-11 NOTE — Telephone Encounter (Signed)
Tc to pt per [facility] regarding abnl pap and need for Colpo per MK.  Gave pt verbal information about Colpo and colpo in pregnancy.  Pt has appt scheduled for Monday 02/04/12 @ 1630 w/ AVS for ROB, added Colpo to visit that day.  Pt requests information be mailed to her home about the Colpo, pt questions answered.  Advised pt no intercourse or anything in the vagina prior to appt, pt voices agreement.

## 2012-02-04 ENCOUNTER — Ambulatory Visit (INDEPENDENT_AMBULATORY_CARE_PROVIDER_SITE_OTHER): Payer: Medicaid Other | Admitting: Obstetrics and Gynecology

## 2012-02-04 ENCOUNTER — Encounter: Payer: Self-pay | Admitting: Obstetrics and Gynecology

## 2012-02-04 VITALS — BP 110/70 | Wt 192.0 lb

## 2012-02-04 DIAGNOSIS — Z331 Pregnant state, incidental: Secondary | ICD-10-CM

## 2012-02-04 NOTE — Progress Notes (Signed)
[redacted]w[redacted]d The patient had a Pap smear which showed CIN-1.  New guidelines suggest a colposcopy is not necessary. The patient had a urine culture which showed 30,000 colonies of beta streptococcus.  The organism was resistant to clindamycin.  The patient says she is allergic to penicillin.  She says her reaction was hives.  She does not recall how long after she took the medication that she had this reaction.  The patient will be referred to the infectious disease clinic so that they can arrange desensitization and then treat the patient with Keflex. Doing well. Return to office in 2 weeks for Glucola screen. Dr. Stefano Gaul

## 2012-02-04 NOTE — Progress Notes (Signed)
Pt is here today for a Colpo and ROB. 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

## 2012-02-08 ENCOUNTER — Telehealth: Payer: Self-pay

## 2012-02-08 ENCOUNTER — Other Ambulatory Visit: Payer: Self-pay

## 2012-02-08 DIAGNOSIS — B955 Unspecified streptococcus as the cause of diseases classified elsewhere: Secondary | ICD-10-CM

## 2012-02-08 NOTE — Telephone Encounter (Signed)
Referral to Infectious Disease Clinic. Ordered 02-07-12

## 2012-02-11 ENCOUNTER — Telehealth: Payer: Self-pay

## 2012-02-11 NOTE — Telephone Encounter (Signed)
APPT made to Infectious Disease Clinic For 02-13-2012 Pt made aware  [facility] CMA

## 2012-02-13 ENCOUNTER — Ambulatory Visit (INDEPENDENT_AMBULATORY_CARE_PROVIDER_SITE_OTHER): Payer: Medicaid Other | Admitting: Infectious Diseases

## 2012-02-13 ENCOUNTER — Encounter: Payer: Self-pay | Admitting: Infectious Diseases

## 2012-02-13 VITALS — BP 114/75 | HR 96 | Temp 98.4°F | Ht 62.0 in | Wt 194.0 lb

## 2012-02-13 DIAGNOSIS — N390 Urinary tract infection, site not specified: Secondary | ICD-10-CM

## 2012-02-13 DIAGNOSIS — O2390 Unspecified genitourinary tract infection in pregnancy, unspecified trimester: Secondary | ICD-10-CM

## 2012-02-13 DIAGNOSIS — O2340 Unspecified infection of urinary tract in pregnancy, unspecified trimester: Secondary | ICD-10-CM

## 2012-02-13 DIAGNOSIS — B951 Streptococcus, group B, as the cause of diseases classified elsewhere: Secondary | ICD-10-CM

## 2012-02-13 DIAGNOSIS — K219 Gastro-esophageal reflux disease without esophagitis: Secondary | ICD-10-CM

## 2012-02-13 MED ORDER — CEPHALEXIN 500 MG PO CAPS: 500.0000 mg | ORAL_CAPSULE | Freq: Two times a day (BID) | ORAL | Status: DC

## 2012-02-13 NOTE — Progress Notes (Signed)
  Subjective:    Patient ID: Katherine Hooper, female    DOB: Jul 02, 1990, 21 y.o.   MRN: [id number]  HPI 21 yo F who is 28 weeks IUP comes to ID clinic for eval of UTI and hx of PEN allergy. Her hx is of childhood allergy. Had hives. Not clear if she received steroid shot. Was seen at her pediatrician for tx, did not require ED visit.  Has taken anbx since but she is unable to recall names.  Has no sx of UTI no dysuria, no blood, no cloudiness. No fever or chills.  Wants to be seen by PT for her back pain and scoliosis.   Review of Systems  Constitutional: Negative for fever, chills, appetite change and unexpected weight change.  Respiratory: Negative for cough and shortness of breath.   Cardiovascular: Positive for chest pain.       Haas with eating, feels like gas distension, tries to "burp it out". Has not tried tums.   Gastrointestinal: Negative for diarrhea and constipation.  Genitourinary: Negative for dysuria, flank pain and difficulty urinating.  Neurological: Negative for headaches.       Objective:   Physical Exam        Assessment & Plan:

## 2012-02-13 NOTE — Assessment & Plan Note (Signed)
Suspect her feelings of chest heaviness/gaseous after eating are related to GERD and changes related to her abdominopelvic anatomy. I suggested she try tums.

## 2012-02-13 NOTE — Assessment & Plan Note (Addendum)
Pt is doing well, asx. Given her pregnancy, will favor treating her. She has no hx of asthma and her reaction previously was intermediate- no anaphylaxis or similar sx. She has about a 2-10% risk of having a similar reaction to cephalosporins. I discussed this with her and asked her to call the # for ID on call over the holiday w/e if she has any problems. I asked her to call Dr Stefano Gaul to see whether she could take benadryl if she has a rash. Will give her 5 days of keflex (per up to date). She can rtc prn

## 2012-02-15 ENCOUNTER — Telehealth: Payer: Self-pay | Admitting: Internal Medicine

## 2012-02-15 NOTE — Telephone Encounter (Signed)
Patient recently started on Keflex for GBS bacteruria during pregnancy (asymptomatic) due to penicillin allergy.  She now reports hives after one dose.  No breathing problems, no swelling.    I have asked her to stop the antibiotic completely and will list cephalosporin as an allergy.  In review, her CFU count for GBS was < 30,000 so I feel that her risk of pregnancy complications is not established to my knowledge and pyelonephritis is lower risk and risk of attempting to cure asymptomatic infection now higher risk due to allergy.    If she requires perinatal GBS coverage, this will need to be done with vancomycin since isolate in urine is clindamycin and erythromycin resistant.

## 2012-02-18 ENCOUNTER — Other Ambulatory Visit: Payer: Medicaid Other

## 2012-02-18 ENCOUNTER — Encounter: Payer: Self-pay | Admitting: Obstetrics and Gynecology

## 2012-02-18 ENCOUNTER — Ambulatory Visit (INDEPENDENT_AMBULATORY_CARE_PROVIDER_SITE_OTHER): Payer: Medicaid Other | Admitting: Obstetrics and Gynecology

## 2012-02-18 VITALS — BP 110/60 | Wt 197.0 lb

## 2012-02-18 DIAGNOSIS — Z23 Encounter for immunization: Secondary | ICD-10-CM

## 2012-02-18 DIAGNOSIS — Z331 Pregnant state, incidental: Secondary | ICD-10-CM

## 2012-02-18 LAB — HEMOGLOBIN: Hemoglobin: 12.2 g/dL (ref 12.0–15.0)

## 2012-02-18 NOTE — Progress Notes (Signed)
[redacted]w[redacted]d glucola today Pt tried to take keflex but had a rxn.  She may need hospital sensitization.  Will refer back to Dr Ninetta Lights

## 2012-02-18 NOTE — Patient Instructions (Signed)
Fetal Movement Counts Patient Name: __________________________________________________ Patient Due Date: ____________________ Kick counts is highly recommended in high risk pregnancies, but it is a good idea for every pregnant woman to do. Start counting fetal movements at 28 weeks of the pregnancy. Fetal movements increase after eating a full meal or eating or drinking something sweet (the blood sugar is higher). It is also important to drink plenty of fluids (well hydrated) before doing the count. Lie on your left side because it helps with the circulation or you can sit in a comfortable chair with your arms over your belly (abdomen) with no distractions around you. DOING THE COUNT  Try to do the count the same time of day each time you do it.  Mark the day and time, then see how long it takes for you to feel 10 movements (kicks, flutters, swishes, rolls). You should have at least 10 movements within 2 hours. You will most likely feel 10 movements in much less than 2 hours. If you do not, wait an hour and count again. After a couple of days you will see a pattern.  What you are looking for is a change in the pattern or not enough counts in 2 hours. Is it taking longer in time to reach 10 movements? SEEK MEDICAL CARE IF:  You feel less than 10 counts in 2 hours. Tried twice.  No movement in one hour.  The pattern is changing or taking longer each day to reach 10 counts in 2 hours.  You feel the baby is not moving as it usually does. Date: ____________ Movements: ____________ Start time: ____________ Finish time: ____________  Date: ____________ Movements: ____________ Start time: ____________ Finish time: ____________ Date: ____________ Movements: ____________ Start time: ____________ Finish time: ____________ Date: ____________ Movements: ____________ Start time: ____________ Finish time: ____________ Date: ____________ Movements: ____________ Start time: ____________ Finish time:  ____________ Date: ____________ Movements: ____________ Start time: ____________ Finish time: ____________ Date: ____________ Movements: ____________ Start time: ____________ Finish time: ____________ Date: ____________ Movements: ____________ Start time: ____________ Finish time: ____________  Date: ____________ Movements: ____________ Start time: ____________ Finish time: ____________ Date: ____________ Movements: ____________ Start time: ____________ Finish time: ____________ Date: ____________ Movements: ____________ Start time: ____________ Finish time: ____________ Date: ____________ Movements: ____________ Start time: ____________ Finish time: ____________ Date: ____________ Movements: ____________ Start time: ____________ Finish time: ____________ Date: ____________ Movements: ____________ Start time: ____________ Finish time: ____________ Date: ____________ Movements: ____________ Start time: ____________ Finish time: ____________  Date: ____________ Movements: ____________ Start time: ____________ Finish time: ____________ Date: ____________ Movements: ____________ Start time: ____________ Finish time: ____________ Date: ____________ Movements: ____________ Start time: ____________ Finish time: ____________ Date: ____________ Movements: ____________ Start time: ____________ Finish time: ____________ Date: ____________ Movements: ____________ Start time: ____________ Finish time: ____________ Date: ____________ Movements: ____________ Start time: ____________ Finish time: ____________ Date: ____________ Movements: ____________ Start time: ____________ Finish time: ____________  Date: ____________ Movements: ____________ Start time: ____________ Finish time: ____________ Date: ____________ Movements: ____________ Start time: ____________ Finish time: ____________ Date: ____________ Movements: ____________ Start time: ____________ Finish time: ____________ Date: ____________ Movements:  ____________ Start time: ____________ Finish time: ____________ Date: ____________ Movements: ____________ Start time: ____________ Finish time: ____________ Date: ____________ Movements: ____________ Start time: ____________ Finish time: ____________ Date: ____________ Movements: ____________ Start time: ____________ Finish time: ____________  Date: ____________ Movements: ____________ Start time: ____________ Finish time: ____________ Date: ____________ Movements: ____________ Start time: ____________ Finish time: ____________ Date: ____________ Movements: ____________ Start time: ____________ Finish time: ____________ Date: ____________ Movements:   ____________ Start time: ____________ Finish time: ____________ Date: ____________ Movements: ____________ Start time: ____________ Finish time: ____________ Date: ____________ Movements: ____________ Start time: ____________ Finish time: ____________ Date: ____________ Movements: ____________ Start time: ____________ Finish time: ____________  Date: ____________ Movements: ____________ Start time: ____________ Finish time: ____________ Date: ____________ Movements: ____________ Start time: ____________ Finish time: ____________ Date: ____________ Movements: ____________ Start time: ____________ Finish time: ____________ Date: ____________ Movements: ____________ Start time: ____________ Finish time: ____________ Date: ____________ Movements: ____________ Start time: ____________ Finish time: ____________ Date: ____________ Movements: ____________ Start time: ____________ Finish time: ____________ Date: ____________ Movements: ____________ Start time: ____________ Finish time: ____________  Date: ____________ Movements: ____________ Start time: ____________ Finish time: ____________ Date: ____________ Movements: ____________ Start time: ____________ Finish time: ____________ Date: ____________ Movements: ____________ Start time: ____________ Finish  time: ____________ Date: ____________ Movements: ____________ Start time: ____________ Finish time: ____________ Date: ____________ Movements: ____________ Start time: ____________ Finish time: ____________ Date: ____________ Movements: ____________ Start time: ____________ Finish time: ____________ Date: ____________ Movements: ____________ Start time: ____________ Finish time: ____________  Date: ____________ Movements: ____________ Start time: ____________ Finish time: ____________ Date: ____________ Movements: ____________ Start time: ____________ Finish time: ____________ Date: ____________ Movements: ____________ Start time: ____________ Finish time: ____________ Date: ____________ Movements: ____________ Start time: ____________ Finish time: ____________ Date: ____________ Movements: ____________ Start time: ____________ Finish time: ____________ Date: ____________ Movements: ____________ Start time: ____________ Finish time: ____________ Document Released: 04/04/2006 Document Revised: 05/28/2011 Document Reviewed: 10/05/2008 [employer] Patient Information 2013 [employer].  

## 2012-02-18 NOTE — Progress Notes (Signed)
Glucola due at 11:42

## 2012-02-19 LAB — RPR

## 2012-02-21 ENCOUNTER — Telehealth: Payer: Self-pay

## 2012-02-21 NOTE — Telephone Encounter (Signed)
Message copied by Rolla Plate on Thu Feb 21, 2012  9:44 AM ------      Message from: Jaymes Graff      Created: Mon Feb 18, 2012 11:51 AM       Please refer pt back to ID [facility]ia Buff.  She had a rxn to Keflex.  Would like next recommendation.  Also add har to the high risk listin lab

## 2012-02-21 NOTE — Telephone Encounter (Signed)
Tc to ID rgd pt lm on vm tcb rgd pt

## 2012-02-22 ENCOUNTER — Other Ambulatory Visit: Payer: Self-pay

## 2012-02-22 ENCOUNTER — Telehealth: Payer: Self-pay

## 2012-02-22 DIAGNOSIS — O99810 Abnormal glucose complicating pregnancy: Secondary | ICD-10-CM

## 2012-02-22 NOTE — Telephone Encounter (Signed)
Message copied by Rolla Plate on Fri Feb 22, 2012 11:29 AM ------      Message from: Jaymes Graff      Created: Fri Feb 22, 2012  1:00 AM       Please tell patient her one hour lab was abnormally high.  She will need to do a loading diet and three hour glucose tolerance test.  Please schedule this.  Thank you

## 2012-02-22 NOTE — Progress Notes (Signed)
Can you call infectious disease and let them know she has a reaction to keflex thanks

## 2012-02-22 NOTE — Telephone Encounter (Signed)
Spoke with pt rgd labs informed 1 hr gtt abnl need 3 hr gtt explained loading diet advised pt will mail sample diet pt has appt 03/03/12 at 8:00 at [facility] [address] pt voice understanding

## 2012-02-28 ENCOUNTER — Telehealth: Payer: Self-pay | Admitting: Obstetrics and Gynecology

## 2012-02-28 ENCOUNTER — Other Ambulatory Visit: Payer: Self-pay | Admitting: Obstetrics and Gynecology

## 2012-02-28 DIAGNOSIS — O2690 Pregnancy related conditions, unspecified, unspecified trimester: Secondary | ICD-10-CM

## 2012-02-28 NOTE — Telephone Encounter (Signed)
Per Dr AVS, pt scheduled for consult with MFM 03/04/12 at 10:00.  Tc to pt. Notified of appt.  Pt states +FM.

## 2012-03-03 ENCOUNTER — Ambulatory Visit (INDEPENDENT_AMBULATORY_CARE_PROVIDER_SITE_OTHER): Payer: Medicaid Other | Admitting: Obstetrics and Gynecology

## 2012-03-03 ENCOUNTER — Encounter: Payer: Self-pay | Admitting: Obstetrics and Gynecology

## 2012-03-03 VITALS — BP 102/60 | Wt 196.0 lb

## 2012-03-03 DIAGNOSIS — Z331 Pregnant state, incidental: Secondary | ICD-10-CM

## 2012-03-03 NOTE — Progress Notes (Signed)
[redacted]w[redacted]d Pt states that she is still having the breaking out that she was having due to the keflex. Rash is on her arms and occasionally legs

## 2012-03-03 NOTE — Progress Notes (Signed)
[redacted]w[redacted]d Pt with a rash on arms.  Macular rash recommend benadryl cream and calamine if not better will refer to derm [facility] reviewed Pt scheduled for 3 hr gtt tomorrow Will contact ID about what to use for pos GBS

## 2012-03-03 NOTE — Patient Instructions (Signed)
Fetal Movement Counts Patient Name: __________________________________________________ Patient Due Date: ____________________ Kick counts is highly recommended in high risk pregnancies, but it is a good idea for every pregnant woman to do. Start counting fetal movements at 28 weeks of the pregnancy. Fetal movements increase after eating a full meal or eating or drinking something sweet (the blood sugar is higher). It is also important to drink plenty of fluids (well hydrated) before doing the count. Lie on your left side because it helps with the circulation or you can sit in a comfortable chair with your arms over your belly (abdomen) with no distractions around you. DOING THE COUNT  Try to do the count the same time of day each time you do it.  Mark the day and time, then see how long it takes for you to feel 10 movements (kicks, flutters, swishes, rolls). You should have at least 10 movements within 2 hours. You will most likely feel 10 movements in much less than 2 hours. If you do not, wait an hour and count again. After a couple of days you will see a pattern.  What you are looking for is a change in the pattern or not enough counts in 2 hours. Is it taking longer in time to reach 10 movements? SEEK MEDICAL CARE IF:  You feel less than 10 counts in 2 hours. Tried twice.  No movement in one hour.  The pattern is changing or taking longer each day to reach 10 counts in 2 hours.  You feel the baby is not moving as it usually does. Date: ____________ Movements: ____________ Start time: ____________ Finish time: ____________  Date: ____________ Movements: ____________ Start time: ____________ Finish time: ____________ Date: ____________ Movements: ____________ Start time: ____________ Finish time: ____________ Date: ____________ Movements: ____________ Start time: ____________ Finish time: ____________ Date: ____________ Movements: ____________ Start time: ____________ Finish time:  ____________ Date: ____________ Movements: ____________ Start time: ____________ Finish time: ____________ Date: ____________ Movements: ____________ Start time: ____________ Finish time: ____________ Date: ____________ Movements: ____________ Start time: ____________ Finish time: ____________  Date: ____________ Movements: ____________ Start time: ____________ Finish time: ____________ Date: ____________ Movements: ____________ Start time: ____________ Finish time: ____________ Date: ____________ Movements: ____________ Start time: ____________ Finish time: ____________ Date: ____________ Movements: ____________ Start time: ____________ Finish time: ____________ Date: ____________ Movements: ____________ Start time: ____________ Finish time: ____________ Date: ____________ Movements: ____________ Start time: ____________ Finish time: ____________ Date: ____________ Movements: ____________ Start time: ____________ Finish time: ____________  Date: ____________ Movements: ____________ Start time: ____________ Finish time: ____________ Date: ____________ Movements: ____________ Start time: ____________ Finish time: ____________ Date: ____________ Movements: ____________ Start time: ____________ Finish time: ____________ Date: ____________ Movements: ____________ Start time: ____________ Finish time: ____________ Date: ____________ Movements: ____________ Start time: ____________ Finish time: ____________ Date: ____________ Movements: ____________ Start time: ____________ Finish time: ____________ Date: ____________ Movements: ____________ Start time: ____________ Finish time: ____________  Date: ____________ Movements: ____________ Start time: ____________ Finish time: ____________ Date: ____________ Movements: ____________ Start time: ____________ Finish time: ____________ Date: ____________ Movements: ____________ Start time: ____________ Finish time: ____________ Date: ____________ Movements:  ____________ Start time: ____________ Finish time: ____________ Date: ____________ Movements: ____________ Start time: ____________ Finish time: ____________ Date: ____________ Movements: ____________ Start time: ____________ Finish time: ____________ Date: ____________ Movements: ____________ Start time: ____________ Finish time: ____________  Date: ____________ Movements: ____________ Start time: ____________ Finish time: ____________ Date: ____________ Movements: ____________ Start time: ____________ Finish time: ____________ Date: ____________ Movements: ____________ Start time: ____________ Finish time: ____________ Date: ____________ Movements:   ____________ Start time: ____________ Finish time: ____________ Date: ____________ Movements: ____________ Start time: ____________ Finish time: ____________ Date: ____________ Movements: ____________ Start time: ____________ Finish time: ____________ Date: ____________ Movements: ____________ Start time: ____________ Finish time: ____________  Date: ____________ Movements: ____________ Start time: ____________ Finish time: ____________ Date: ____________ Movements: ____________ Start time: ____________ Finish time: ____________ Date: ____________ Movements: ____________ Start time: ____________ Finish time: ____________ Date: ____________ Movements: ____________ Start time: ____________ Finish time: ____________ Date: ____________ Movements: ____________ Start time: ____________ Finish time: ____________ Date: ____________ Movements: ____________ Start time: ____________ Finish time: ____________ Date: ____________ Movements: ____________ Start time: ____________ Finish time: ____________  Date: ____________ Movements: ____________ Start time: ____________ Finish time: ____________ Date: ____________ Movements: ____________ Start time: ____________ Finish time: ____________ Date: ____________ Movements: ____________ Start time: ____________ Finish  time: ____________ Date: ____________ Movements: ____________ Start time: ____________ Finish time: ____________ Date: ____________ Movements: ____________ Start time: ____________ Finish time: ____________ Date: ____________ Movements: ____________ Start time: ____________ Finish time: ____________ Date: ____________ Movements: ____________ Start time: ____________ Finish time: ____________  Date: ____________ Movements: ____________ Start time: ____________ Finish time: ____________ Date: ____________ Movements: ____________ Start time: ____________ Finish time: ____________ Date: ____________ Movements: ____________ Start time: ____________ Finish time: ____________ Date: ____________ Movements: ____________ Start time: ____________ Finish time: ____________ Date: ____________ Movements: ____________ Start time: ____________ Finish time: ____________ Date: ____________ Movements: ____________ Start time: ____________ Finish time: ____________ Document Released: 04/04/2006 Document Revised: 05/28/2011 Document Reviewed: 10/05/2008 [employer] Patient Information 2013 [employer].  

## 2012-03-04 ENCOUNTER — Encounter (HOSPITAL_COMMUNITY): Payer: Self-pay

## 2012-03-04 ENCOUNTER — Ambulatory Visit (HOSPITAL_COMMUNITY)
Admission: RE | Admit: 2012-03-04 | Discharge: 2012-03-04 | Disposition: A | Discharge status: Home / Self Care | Payer: Medicaid Other | Source: Ambulatory Visit | Attending: Obstetrics and Gynecology | Admitting: Obstetrics and Gynecology

## 2012-03-04 DIAGNOSIS — B951 Streptococcus, group B, as the cause of diseases classified elsewhere: Secondary | ICD-10-CM

## 2012-03-04 DIAGNOSIS — Z880 Allergy status to penicillin: Secondary | ICD-10-CM | POA: Insufficient documentation

## 2012-03-04 DIAGNOSIS — Z22330 Carrier of Group B streptococcus: Secondary | ICD-10-CM | POA: Insufficient documentation

## 2012-03-04 DIAGNOSIS — Z331 Pregnant state, incidental: Secondary | ICD-10-CM

## 2012-03-04 DIAGNOSIS — O99891 Other specified diseases and conditions complicating pregnancy: Secondary | ICD-10-CM | POA: Insufficient documentation
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
## 2012-03-04 NOTE — Consult Note (Signed)
Dr. Normand Sloop,  Thank you for allowing me the opportunity to participate in the care of Ms. Katherine Hooper. [name] has group B strep in her urine which warrants treatment in labor.   This is complicated by the fact that she has a penicillin and keflex allergy.  The GBS urine culture was clindamycin and erythromycin resistant.  It was vancomycin sensitive.  As per the CDC guidelines [name] should be treated in labor with Vancomycin 1g IV q12 hrs until delivery.    Thank you for allowing [place] to participate in the care of this patient. Molly Maduro MD  MFM Fellow

## 2012-03-06 NOTE — Addendum Note (Signed)
Encounter addended by: Ty Hilts, RN on: 03/06/2012  8:21 AM<BR>     Documentation filed: Charges VN

## 2012-03-17 ENCOUNTER — Telehealth: Payer: Self-pay | Admitting: Obstetrics and Gynecology

## 2012-03-17 ENCOUNTER — Other Ambulatory Visit: Payer: Self-pay | Admitting: Obstetrics and Gynecology

## 2012-03-17 DIAGNOSIS — O99810 Abnormal glucose complicating pregnancy: Secondary | ICD-10-CM

## 2012-03-17 NOTE — Telephone Encounter (Signed)
Spoke with pt about 3 hr GTT.  Pt stated got appt. mixed up and did't go to 3 hr GTT. Resch 3 hr GTT for 03-24-2012 @8 :30.  Pt to pick up diet and appt info at visit on 03-20-2012

## 2012-03-19 NOTE — L&D Delivery Note (Signed)
Delivery Note At 5:03 AM a viable female was delivered via Vaginal, Spontaneous Delivery (Presentation: ; Occiput Anterior).  Vigorous cry. APGAR: 8, 9; weight pending.   Placenta status: Intact, Spontaneous.  Cord: 3 vessels with the following complications: None.  Cord pH: NA. Baby covered in large amount of vernix.  Anesthesia: Epidural  Episiotomy: None Lacerations: Small left Labial, hemostatic Suture Repair: NA Est. Blood Loss (mL): 200  Mom to postpartum.  Baby to nursery-stable. Placenta to: BS Feeding: Breast Circ: OP Contraception: undecided  Dorathy Kinsman 05/06/2012, 5:21 AM

## 2012-03-20 ENCOUNTER — Encounter: Payer: Self-pay | Admitting: Obstetrics and Gynecology

## 2012-03-20 ENCOUNTER — Ambulatory Visit (INDEPENDENT_AMBULATORY_CARE_PROVIDER_SITE_OTHER): Payer: Medicaid Other | Admitting: Obstetrics and Gynecology

## 2012-03-20 VITALS — BP 110/62 | Wt 199.0 lb

## 2012-03-20 DIAGNOSIS — Z331 Pregnant state, incidental: Secondary | ICD-10-CM

## 2012-03-20 DIAGNOSIS — N898 Other specified noninflammatory disorders of vagina: Secondary | ICD-10-CM

## 2012-03-20 NOTE — Progress Notes (Signed)
Pt has still has white  pasty discharge no itching and still has light odor Pt needs 3 day diet plan for GDM

## 2012-03-20 NOTE — Progress Notes (Signed)
[redacted]w[redacted]d Wet prep: Normal Vagina structures given for three-hour GTT.  Scheduled for March 24, 2012. Rash has improved. Return to office in 2 weeks. Dr. Stefano Gaul

## 2012-03-26 LAB — GLUCOSE TOLERANCE, 3 HOURS
Glucose Tolerance, Fasting: 86 mg/dL (ref 70–104)
Glucose, GTT - 3 Hour: 102 mg/dL (ref 70–144)

## 2012-04-03 ENCOUNTER — Ambulatory Visit: Payer: Medicaid Other | Admitting: Obstetrics and Gynecology

## 2012-04-03 VITALS — BP 104/62 | Wt 200.0 lb

## 2012-04-03 DIAGNOSIS — Z331 Pregnant state, incidental: Secondary | ICD-10-CM

## 2012-04-03 NOTE — Progress Notes (Signed)
[redacted]w[redacted]d Date of strep, GC, Chlamydia sent. Return to office in 1 week. Dr. Stefano Gaul

## 2012-04-03 NOTE — Progress Notes (Signed)
[redacted]w[redacted]d  Pt desires cervix check today.

## 2012-04-04 ENCOUNTER — Ambulatory Visit: Payer: Medicaid Other | Admitting: Obstetrics and Gynecology

## 2012-04-04 VITALS — BP 110/58 | Wt 202.0 lb

## 2012-04-04 DIAGNOSIS — R102 Pelvic and perineal pain: Secondary | ICD-10-CM

## 2012-04-04 LAB — POCT URINALYSIS DIPSTICK
Bilirubin, UA: NEGATIVE
Glucose, UA: NEGATIVE
Nitrite, UA: NEGATIVE
pH, UA: 5

## 2012-04-04 LAB — GC/CHLAMYDIA PROBE AMP: GC Probe RNA: NEGATIVE

## 2012-04-04 NOTE — Progress Notes (Signed)
Pt stated been having pubic bone pain / pressure . Pt wants a cervix check today.

## 2012-04-04 NOTE — Progress Notes (Signed)
[redacted]w[redacted]d C/o pelvic pain, constant, worse at night, can't go to work, can't sleep, difficulty walking Denies ctx,  Will send UA for cx Likely SP pain rec tylenol ATC, ice, rest over the weekend OOW until Tuesday RTO 1wk rv'd FKC and labor sx's

## 2012-04-06 LAB — URINE CULTURE: Colony Count: NO GROWTH

## 2012-04-10 ENCOUNTER — Encounter: Payer: Self-pay | Admitting: Obstetrics and Gynecology

## 2012-04-10 ENCOUNTER — Ambulatory Visit: Payer: Medicaid Other | Admitting: Obstetrics and Gynecology

## 2012-04-10 VITALS — BP 104/70 | Wt 203.0 lb

## 2012-04-10 DIAGNOSIS — Z331 Pregnant state, incidental: Secondary | ICD-10-CM

## 2012-04-10 NOTE — Progress Notes (Signed)
Pt requests cervix check today.  

## 2012-04-10 NOTE — Progress Notes (Signed)
[redacted]w[redacted]d A/P GBS positive Fetal kick counts reviewed Labor reviewed with pt All patients  questions answered

## 2012-04-15 ENCOUNTER — Encounter: Payer: Self-pay | Admitting: Obstetrics and Gynecology

## 2012-04-15 ENCOUNTER — Ambulatory Visit: Payer: Medicaid Other | Admitting: Obstetrics and Gynecology

## 2012-04-15 VITALS — BP 100/58 | Wt 201.0 lb

## 2012-04-15 DIAGNOSIS — Z331 Pregnant state, incidental: Secondary | ICD-10-CM

## 2012-04-15 NOTE — Progress Notes (Signed)
[redacted]w[redacted]d Pt having sharp shooting pelvic pains x 7:00AM  Pt wants cervix checked

## 2012-04-15 NOTE — Progress Notes (Signed)
[redacted]w[redacted]d Labor Signs Reviewed Increased risk of C-section with IOL discussed

## 2012-04-16 ENCOUNTER — Encounter: Payer: Medicaid Other | Admitting: Obstetrics and Gynecology

## 2012-04-19 ENCOUNTER — Inpatient Hospital Stay (HOSPITAL_COMMUNITY)
Admission: AD | Admit: 2012-04-19 | Discharge: 2012-04-19 | Disposition: A | Discharge status: Home / Self Care | Payer: Medicaid Other | Source: Ambulatory Visit | Attending: Obstetrics and Gynecology | Admitting: Obstetrics and Gynecology

## 2012-04-19 ENCOUNTER — Encounter (HOSPITAL_COMMUNITY): Payer: Self-pay | Admitting: Family

## 2012-04-19 DIAGNOSIS — O479 False labor, unspecified: Secondary | ICD-10-CM | POA: Insufficient documentation

## 2012-04-19 DIAGNOSIS — Z880 Allergy status to penicillin: Secondary | ICD-10-CM

## 2012-04-19 DIAGNOSIS — B951 Streptococcus, group B, as the cause of diseases classified elsewhere: Secondary | ICD-10-CM

## 2012-04-19 DIAGNOSIS — O4690 Antepartum hemorrhage, unspecified, unspecified trimester: Secondary | ICD-10-CM

## 2012-04-19 MED ORDER — OXYCODONE-ACETAMINOPHEN 5-325 MG PO TABS
2.0000 | ORAL_TABLET | Freq: Once | ORAL | Status: AC
  Administered 2012-04-19: 2 via ORAL
  Filled 2012-04-19: qty 2

## 2012-04-19 MED ORDER — ZOLPIDEM TARTRATE 10 MG PO TABS: 10.0000 mg | ORAL_TABLET | Freq: Every evening | ORAL | Status: DC | PRN

## 2012-04-19 MED ORDER — OXYCODONE-ACETAMINOPHEN 5-325 MG PO TABS: 1.0000 | ORAL_TABLET | ORAL | Status: DC | PRN

## 2012-04-19 NOTE — MAU Note (Signed)
Pt presents with complaints of contractions that started last night and became worse this morning. States baby is active and denies any bleeding or LOF

## 2012-04-19 NOTE — MAU Note (Signed)
Patient presents to MAU with c/o contracting every 5 minutes from 1100 - 1130 today. Reports + fetal movement; Denies LOF, vaginal bleeding.  Reports she was 2cm on Tuesday.

## 2012-04-19 NOTE — MAU Provider Note (Signed)
  History     CSN: [id number]  Arrival date and time: 04/19/12 1412   None     Chief Complaint  Patient presents with  . Labor Eval   HPI Comments: Pt is a G2P1 at [redacted]w[redacted]d w c/o ctx, states she had 5 min ctx for 30 min then they went away, but pelvic discomfort continues. Pt c/o pain w walking. Denies any VB, LOF, has GFM.       Past Medical History  Diagnosis Date  . Scoliosis   . Chlamydia   . Infection     BV x 2;currently on Flagyl for BV  . Group B streptococcal infection in pregnancy 2009  . Anemia     during last pregnancy  . Scoliosis     Past Surgical History  Procedure Date  . No past surgeries     Family History  Problem Relation Age of Onset  . Other Neg Hx   . Hypertension Maternal Grandmother   . Hypertension Maternal Grandfather   . Hypertension Paternal Grandmother   . Hypertension Paternal Grandfather   . Diabetes Maternal Grandmother   . Diabetes Maternal Grandfather   . Asthma Maternal Grandmother   . Asthma Cousin     Maternal  . Stroke Maternal Grandfather   . Kidney disease Maternal Grandmother     Dialysis    History  Substance Use Topics  . Smoking status: Former Smoker -- 0.2 packs/day for 10 years    Types: Cigarettes    Quit date: 09/17/2011  . Smokeless tobacco: Never Used  . Alcohol Use: No    Allergies:  Allergies  Allergen Reactions  . Penicillins Hives    No prescriptions prior to admission    Review of Systems  Musculoskeletal:       Pelvic pain   All other systems reviewed and are negative.   Physical Exam   Blood pressure 132/78, pulse 118, temperature 98.2 F (36.8 C), temperature source Oral, resp. rate 16, height 5' 2.5" (1.588 m), weight 202 lb (91.627 kg), last menstrual period 07/26/2011, SpO2 100.00%.  Physical Exam  Nursing note and vitals reviewed. Constitutional: She is oriented to person, place, and time. She appears well-developed and well-nourished.  HENT:  Head: Normocephalic.  Eyes:  Pupils are equal, round, and reactive to light.  Neck: Normal range of motion.  Cardiovascular: Regular rhythm and normal heart sounds.        Slightly tachycardic   Respiratory: Effort normal and breath sounds normal.  GI: Soft. Bowel sounds are normal.  Genitourinary: Vagina normal.  Musculoskeletal: Normal range of motion.  Neurological: She is alert and oriented to person, place, and time. She has normal reflexes.  Skin: Skin is warm and dry.  Psychiatric: She has a normal mood and affect. Her behavior is normal.   FHR 150 cat 1 toco none some UI   MAU Course  Procedures    Assessment and Plan  IUP at [redacted]w[redacted]d 2cm per RN exam, no change from office exam Not in active labor  Dc home 2 tabs percocet now,  RX for ambien at HS #15 and percocet 1-2 tabs prn pain, #15  PO hydrate  rv'd FKC and labor Keep appt next week   Jimmi Sidener M 04/19/2012, 3:56 PM

## 2012-04-23 ENCOUNTER — Ambulatory Visit: Payer: Medicaid Other | Admitting: Obstetrics and Gynecology

## 2012-04-23 VITALS — BP 110/64 | Wt 207.0 lb

## 2012-04-23 DIAGNOSIS — Z331 Pregnant state, incidental: Secondary | ICD-10-CM

## 2012-04-23 NOTE — Progress Notes (Signed)
[redacted]w[redacted]d  Pt c/o: sharp pain  Pt desires cervix check.  Pt wants to discuss induction.

## 2012-04-23 NOTE — Progress Notes (Signed)
[redacted]w[redacted]d doing well. Return to office in 1 week. Dr. Stefano Gaul

## 2012-05-01 ENCOUNTER — Encounter: Payer: Medicaid Other | Admitting: Obstetrics and Gynecology

## 2012-05-05 ENCOUNTER — Telehealth (HOSPITAL_COMMUNITY): Payer: Self-pay | Admitting: *Deleted

## 2012-05-05 ENCOUNTER — Inpatient Hospital Stay (HOSPITAL_COMMUNITY)
Admission: AD | Admit: 2012-05-05 | Discharge: 2012-05-08 | DRG: 775 | Disposition: A | Discharge status: Home / Self Care | Payer: Medicaid Other | Source: Ambulatory Visit | Attending: Obstetrics and Gynecology | Admitting: Obstetrics and Gynecology

## 2012-05-05 ENCOUNTER — Encounter (HOSPITAL_COMMUNITY): Payer: Self-pay | Admitting: *Deleted

## 2012-05-05 DIAGNOSIS — Z880 Allergy status to penicillin: Secondary | ICD-10-CM

## 2012-05-05 DIAGNOSIS — O26899 Other specified pregnancy related conditions, unspecified trimester: Secondary | ICD-10-CM | POA: Diagnosis not present

## 2012-05-05 DIAGNOSIS — Z22330 Carrier of Group B streptococcus: Secondary | ICD-10-CM

## 2012-05-05 DIAGNOSIS — O4690 Antepartum hemorrhage, unspecified, unspecified trimester: Secondary | ICD-10-CM

## 2012-05-05 DIAGNOSIS — O4290 Premature rupture of membranes, unspecified as to length of time between rupture and onset of labor, unspecified weeks of gestation: Principal | ICD-10-CM | POA: Diagnosis present

## 2012-05-05 DIAGNOSIS — B951 Streptococcus, group B, as the cause of diseases classified elsewhere: Secondary | ICD-10-CM

## 2012-05-05 DIAGNOSIS — O99892 Other specified diseases and conditions complicating childbirth: Secondary | ICD-10-CM | POA: Diagnosis present

## 2012-05-05 DIAGNOSIS — L298 Other pruritus: Secondary | ICD-10-CM | POA: Diagnosis not present

## 2012-05-05 DIAGNOSIS — L2989 Other pruritus: Secondary | ICD-10-CM | POA: Diagnosis not present

## 2012-05-05 HISTORY — DX: Anogenital (venereal) warts: A63.0

## 2012-05-05 HISTORY — DX: Unspecified abnormal cytological findings in specimens from cervix uteri: R87.619

## 2012-05-05 HISTORY — DX: Gastro-esophageal reflux disease without esophagitis: K21.9

## 2012-05-05 HISTORY — DX: Reserved for concepts with insufficient information to code with codable children: IMO0002

## 2012-05-05 LAB — CBC
Hemoglobin: 12.6 g/dL (ref 12.0–15.0)
MCH: 30.2 pg (ref 26.0–34.0)
MCHC: 33.4 g/dL (ref 30.0–36.0)
RDW: 14 % (ref 11.5–15.5)

## 2012-05-05 MED ORDER — IBUPROFEN 600 MG PO TABS: 600.0000 mg | ORAL_TABLET | Freq: Four times a day (QID) | ORAL | Status: DC | PRN

## 2012-05-05 MED ORDER — HYDROXYZINE HCL 50 MG PO TABS: 50.0000 mg | ORAL_TABLET | Freq: Four times a day (QID) | ORAL | Status: DC | PRN

## 2012-05-05 MED ORDER — TERBUTALINE SULFATE 1 MG/ML IJ SOLN: 0.2500 mg | Freq: Once | INTRAMUSCULAR | Status: AC | PRN

## 2012-05-05 MED ORDER — DIPHENHYDRAMINE HCL 50 MG/ML IJ SOLN
50.0000 mg | Freq: Once | INTRAMUSCULAR | Status: AC
  Administered 2012-05-05: 50 mg via INTRAVENOUS
  Filled 2012-05-05: qty 1

## 2012-05-05 MED ORDER — OXYTOCIN 40 UNITS IN LACTATED RINGERS INFUSION - SIMPLE MED
62.5000 mL/h | INTRAVENOUS | Status: DC
  Administered 2012-05-06: 62.5 mL/h via INTRAVENOUS

## 2012-05-05 MED ORDER — CITRIC ACID-SODIUM CITRATE 334-500 MG/5ML PO SOLN: 30.0000 mL | ORAL | Status: DC | PRN

## 2012-05-05 MED ORDER — OXYCODONE-ACETAMINOPHEN 5-325 MG PO TABS: 1.0000 | ORAL_TABLET | ORAL | Status: DC | PRN

## 2012-05-05 MED ORDER — OXYTOCIN BOLUS FROM INFUSION
500.0000 mL | INTRAVENOUS | Status: DC
  Administered 2012-05-06: 500 mL via INTRAVENOUS

## 2012-05-05 MED ORDER — FLEET ENEMA 7-19 GM/118ML RE ENEM: 1.0000 | ENEMA | RECTAL | Status: DC | PRN

## 2012-05-05 MED ORDER — OXYTOCIN 40 UNITS IN LACTATED RINGERS INFUSION - SIMPLE MED
1.0000 m[IU]/min | INTRAVENOUS | Status: DC
  Administered 2012-05-05: 2 m[IU]/min via INTRAVENOUS
  Filled 2012-05-05: qty 1000

## 2012-05-05 MED ORDER — VANCOMYCIN HCL IN DEXTROSE 1-5 GM/200ML-% IV SOLN
1000.0000 mg | Freq: Two times a day (BID) | INTRAVENOUS | Status: DC
  Administered 2012-05-05: 1000 mg via INTRAVENOUS
  Filled 2012-05-05 (×2): qty 200

## 2012-05-05 MED ORDER — FENTANYL CITRATE 0.05 MG/ML IJ SOLN
50.0000 ug | INTRAMUSCULAR | Status: DC | PRN
  Administered 2012-05-06: 50 ug via INTRAVENOUS
  Filled 2012-05-05: qty 2

## 2012-05-05 MED ORDER — LIDOCAINE HCL (PF) 1 % IJ SOLN
30.0000 mL | INTRAMUSCULAR | Status: DC | PRN
  Filled 2012-05-05: qty 30

## 2012-05-05 MED ORDER — ACETAMINOPHEN 325 MG PO TABS: 650.0000 mg | ORAL_TABLET | ORAL | Status: DC | PRN

## 2012-05-05 MED ORDER — HYDROXYZINE HCL 50 MG/ML IM SOLN: 50.0000 mg | Freq: Four times a day (QID) | INTRAMUSCULAR | Status: DC | PRN

## 2012-05-05 MED ORDER — LACTATED RINGERS IV SOLN
INTRAVENOUS | Status: DC
  Administered 2012-05-06: 02:00:00 via INTRAVENOUS

## 2012-05-05 MED ORDER — ONDANSETRON HCL 4 MG/2ML IJ SOLN: 4.0000 mg | Freq: Four times a day (QID) | INTRAMUSCULAR | Status: DC | PRN

## 2012-05-05 MED ORDER — LACTATED RINGERS IV SOLN
500.0000 mL | INTRAVENOUS | Status: DC | PRN
  Administered 2012-05-06: 500 mL via INTRAVENOUS

## 2012-05-05 NOTE — Progress Notes (Signed)
Pt called out at 2310 due to believed allergic reaction described as severe itching on scalp and raised welts on lower left arm; no respiratory signs/symptoms.  RN assessed, stopped Vanc infusion, and called pharmacy at 2311.  Pharmacy advised RN to call provider.  Ivonne Andrew CNM called at 2315.  Smith CNM reported to pt room, assessed pt, and gave orders leave Vanc off, give benadryl, and put pulse oximeter on.  RN awaiting orders for restarting Vanc.

## 2012-05-05 NOTE — Telephone Encounter (Signed)
Preadmission screen  

## 2012-05-06 ENCOUNTER — Encounter (HOSPITAL_COMMUNITY): Payer: Self-pay | Admitting: Anesthesiology

## 2012-05-06 ENCOUNTER — Inpatient Hospital Stay (HOSPITAL_COMMUNITY): Payer: Medicaid Other | Admitting: Anesthesiology

## 2012-05-06 LAB — TYPE AND SCREEN: ABO/RH(D): B POS

## 2012-05-06 LAB — RPR: RPR Ser Ql: NONREACTIVE

## 2012-05-06 MED ORDER — SENNOSIDES-DOCUSATE SODIUM 8.6-50 MG PO TABS
2.0000 | ORAL_TABLET | Freq: Every day | ORAL | Status: DC
  Administered 2012-05-06 – 2012-05-07 (×2): 2 via ORAL

## 2012-05-06 MED ORDER — DIPHENHYDRAMINE HCL 50 MG/ML IJ SOLN: 12.5000 mg | INTRAMUSCULAR | Status: DC | PRN

## 2012-05-06 MED ORDER — LACTATED RINGERS IV SOLN: 500.0000 mL | Freq: Once | INTRAVENOUS | Status: DC

## 2012-05-06 MED ORDER — FENTANYL 2.5 MCG/ML BUPIVACAINE 1/10 % EPIDURAL INFUSION (WH - ANES)
INTRAMUSCULAR | Status: DC | PRN
  Administered 2012-05-06: 14 mL/h via EPIDURAL

## 2012-05-06 MED ORDER — ONDANSETRON HCL 4 MG PO TABS: 4.0000 mg | ORAL_TABLET | ORAL | Status: DC | PRN

## 2012-05-06 MED ORDER — PHENYLEPHRINE 40 MCG/ML (10ML) SYRINGE FOR IV PUSH (FOR BLOOD PRESSURE SUPPORT): 80.0000 ug | PREFILLED_SYRINGE | INTRAVENOUS | Status: DC | PRN

## 2012-05-06 MED ORDER — TETANUS-DIPHTH-ACELL PERTUSSIS 5-2.5-18.5 LF-MCG/0.5 IM SUSP
0.5000 mL | Freq: Once | INTRAMUSCULAR | Status: AC
  Administered 2012-05-07: 0.5 mL via INTRAMUSCULAR
  Filled 2012-05-06: qty 0.5

## 2012-05-06 MED ORDER — EPHEDRINE 5 MG/ML INJ
10.0000 mg | INTRAVENOUS | Status: DC | PRN
  Filled 2012-05-06: qty 4

## 2012-05-06 MED ORDER — LACTATED RINGERS IV SOLN
INTRAVENOUS | Status: DC
  Administered 2012-05-06: 04:00:00 via INTRAUTERINE

## 2012-05-06 MED ORDER — VANCOMYCIN HCL IN DEXTROSE 1-5 GM/200ML-% IV SOLN
1000.0000 mg | Freq: Two times a day (BID) | INTRAVENOUS | Status: DC
  Administered 2012-05-06: 1000 mg via INTRAVENOUS
  Filled 2012-05-06: qty 200

## 2012-05-06 MED ORDER — OXYCODONE-ACETAMINOPHEN 5-325 MG PO TABS
1.0000 | ORAL_TABLET | ORAL | Status: DC | PRN
  Administered 2012-05-06 – 2012-05-08 (×7): 1 via ORAL
  Filled 2012-05-06 (×8): qty 1

## 2012-05-06 MED ORDER — DIPHENHYDRAMINE HCL 25 MG PO CAPS: 25.0000 mg | ORAL_CAPSULE | Freq: Four times a day (QID) | ORAL | Status: DC | PRN

## 2012-05-06 MED ORDER — FERROUS SULFATE 325 (65 FE) MG PO TABS
325.0000 mg | ORAL_TABLET | Freq: Two times a day (BID) | ORAL | Status: DC
  Administered 2012-05-06 – 2012-05-08 (×5): 325 mg via ORAL
  Filled 2012-05-06 (×6): qty 1

## 2012-05-06 MED ORDER — EPHEDRINE 5 MG/ML INJ: 10.0000 mg | INTRAVENOUS | Status: DC | PRN

## 2012-05-06 MED ORDER — SIMETHICONE 80 MG PO CHEW: 80.0000 mg | CHEWABLE_TABLET | ORAL | Status: DC | PRN

## 2012-05-06 MED ORDER — MEASLES, MUMPS & RUBELLA VAC ~~LOC~~ INJ
0.5000 mL | INJECTION | Freq: Once | SUBCUTANEOUS | Status: DC
  Filled 2012-05-06: qty 0.5

## 2012-05-06 MED ORDER — LANOLIN HYDROUS EX OINT: 1.0000 "application " | TOPICAL_OINTMENT | CUTANEOUS | Status: DC | PRN

## 2012-05-06 MED ORDER — IBUPROFEN 600 MG PO TABS
600.0000 mg | ORAL_TABLET | Freq: Four times a day (QID) | ORAL | Status: DC
  Administered 2012-05-06 – 2012-05-08 (×9): 600 mg via ORAL
  Filled 2012-05-06 (×9): qty 1

## 2012-05-06 MED ORDER — LIDOCAINE HCL (PF) 1 % IJ SOLN
INTRAMUSCULAR | Status: DC | PRN
  Administered 2012-05-06 (×2): 4 mL

## 2012-05-06 MED ORDER — PRENATAL MULTIVITAMIN CH
1.0000 | ORAL_TABLET | Freq: Every day | ORAL | Status: DC
  Administered 2012-05-06 – 2012-05-08 (×3): 1 via ORAL
  Filled 2012-05-06 (×3): qty 1

## 2012-05-06 MED ORDER — METHYLERGONOVINE MALEATE 0.2 MG/ML IJ SOLN: 0.2000 mg | INTRAMUSCULAR | Status: DC | PRN

## 2012-05-06 MED ORDER — FENTANYL 2.5 MCG/ML BUPIVACAINE 1/10 % EPIDURAL INFUSION (WH - ANES)
14.0000 mL/h | INTRAMUSCULAR | Status: DC
  Filled 2012-05-06: qty 125

## 2012-05-06 MED ORDER — ONDANSETRON HCL 4 MG/2ML IJ SOLN: 4.0000 mg | INTRAMUSCULAR | Status: DC | PRN

## 2012-05-06 MED ORDER — ZOLPIDEM TARTRATE 5 MG PO TABS: 5.0000 mg | ORAL_TABLET | Freq: Every evening | ORAL | Status: DC | PRN

## 2012-05-06 MED ORDER — PNEUMOCOCCAL VAC POLYVALENT 25 MCG/0.5ML IJ INJ
0.5000 mL | INJECTION | INTRAMUSCULAR | Status: AC
  Filled 2012-05-06: qty 0.5

## 2012-05-06 MED ORDER — METHYLERGONOVINE MALEATE 0.2 MG PO TABS: 0.2000 mg | ORAL_TABLET | ORAL | Status: DC | PRN

## 2012-05-06 MED ORDER — DIBUCAINE 1 % RE OINT
1.0000 "application " | TOPICAL_OINTMENT | RECTAL | Status: DC | PRN
  Filled 2012-05-06: qty 28

## 2012-05-06 MED ORDER — WITCH HAZEL-GLYCERIN EX PADS: 1.0000 "application " | MEDICATED_PAD | CUTANEOUS | Status: DC | PRN

## 2012-05-06 MED ORDER — PHENYLEPHRINE 40 MCG/ML (10ML) SYRINGE FOR IV PUSH (FOR BLOOD PRESSURE SUPPORT)
80.0000 ug | PREFILLED_SYRINGE | INTRAVENOUS | Status: DC | PRN
  Filled 2012-05-06: qty 5

## 2012-05-06 MED ORDER — MAGNESIUM HYDROXIDE 400 MG/5ML PO SUSP: 30.0000 mL | ORAL | Status: DC | PRN

## 2012-05-06 MED ORDER — BENZOCAINE-MENTHOL 20-0.5 % EX AERO
1.0000 "application " | INHALATION_SPRAY | CUTANEOUS | Status: DC | PRN
  Filled 2012-05-06 (×2): qty 56

## 2012-05-06 NOTE — H&P (Signed)
HPI: Katherine Hooper is a 22 y.o. year old G69P1001 female at [redacted]w[redacted]d weeks gestation by LMP verified by 14 week [place] who presents to MAU reporting Spontaneous rupture of membranes at 1600. Denies contractions, VB.   Seen in MAU at 14v weks for VB. Started prenatal care at [facility] at 23 weeks.   Patient Active Problem List  Diagnosis  . Pregnant state, incidental  . GBS (group B streptococcus) UTI complicating pregnancy. Clinda and E-mycin resistant.   . Allergy to penicillin and cephalosporins  . Vaginal bleeding in pregnancy  . UA cx +GBS 30,000, no tx, but needs tx during labor   . GERD (gastroesophageal reflux disease)    History OB History   Grav Para Term Preterm Abortions TAB SAB Ect Mult Living   2 1 1  0 0 0 0 0 0 1     Past Medical History  Diagnosis Date  . Scoliosis   . Chlamydia   . Infection     BV x 2;currently on Flagyl for BV  . Group B streptococcal infection in pregnancy 2009  . Anemia     during last pregnancy  . Scoliosis   . GERD (gastroesophageal reflux disease)   . Abnormal Pap smear   . HPV (human papilloma virus) anogenital infection    Past Surgical History  Procedure Laterality Date  . No past surgeries     Family History: family history includes Asthma in her cousin and maternal grandmother; Diabetes in her maternal grandfather and maternal grandmother; Hypertension in her maternal grandfather, maternal grandmother, paternal grandfather, and paternal grandmother; Kidney disease in her maternal grandmother; and Stroke in her maternal grandfather.  There is no history of Other. Social History:  reports that she quit smoking about 7 months ago. Her smoking use included Cigarettes. She has a 2.5 pack-year smoking history. She has never used smokeless tobacco. She reports that she does not drink alcohol or use illicit drugs.   Prenatal Transfer Tool  Maternal Diabetes: No Genetic Screening: Declined Maternal Ultrasounds/Referrals: Normal Fetal  Ultrasounds or other Referrals:  None Maternal Substance Abuse:  No Significant Maternal Medications:  None Significant Maternal Lab Results:  Lab values include: Group B Strep positive Other Comments:  Late to care. Too late for genetic screening.  Review of Systems  Constitutional: Negative for fever and chills.  Eyes: Negative for blurred vision.  Gastrointestinal: Negative for abdominal pain.  Neurological: Negative for headaches.   Dilation: 3 Effacement (%): 50 Station: -1 Exam by:: V Mayan Dolney CNM  Blood pressure 137/80, pulse 96, temperature 97.5 F (36.4 C), temperature source Oral, resp. rate 20, height 5' 1.5" (1.562 m), weight 92.08 kg (203 lb), last menstrual period 07/26/2011, SpO2 100.00%. Maternal Exam:  Uterine Assessment: Contraction frequency is irregular.  Painless.  Abdomen: Fundal height is S=D.   Fetal presentation: vertex  Introitus: Normal vulva. Vulva is negative for lesion.  Amniotic fluid character: clear. Grossly ruptured.  Pelvis: adequate for delivery.   Cervix: Cervix evaluated by digital exam.     Fetal Exam Fetal Monitor Review: Mode: ultrasound.   Baseline rate: 140.  Variability: moderate (6-25 bpm).   Pattern: accelerations present and no decelerations.    Fetal State Assessment: Category I - tracings are normal.     Physical Exam  Nursing note and vitals reviewed. Constitutional: She is oriented to person, place, and time. She appears well-developed and well-nourished. No distress.  HENT:  Head: Normocephalic.  Cardiovascular: Normal rate, regular rhythm and normal heart sounds.  Respiratory: Effort normal and breath sounds normal.  GI: Soft. There is no tenderness.  Genitourinary: Vagina normal and uterus normal. There is no rash on the right labia. There is no rash on the left labia. Vulva exhibits no lesion. No bleeding around the vagina.  Musculoskeletal: Normal range of motion. She exhibits no edema and no tenderness.   Neurological: She is alert and oriented to person, place, and time. She has normal reflexes.  Skin: Skin is warm and dry.  Psychiatric: She has a normal mood and affect.    Prenatal labs: ABO, Rh: B/POS/-- (10/16 1540) Antibody: NEG (10/16 1540) Rubella: 159.8 (10/16 1540) RPR: NON REAC (12/02 1044)  HBsAg: NEGATIVE (10/16 1540)  HIV: NON REACTIVE (10/16 1540)  GBS: POSITIVE (01/16 1236)  1 hour GTT 146. 3 hour GTT normal Too late for genetic screening. Normal 23 week anatomy scan.  Assessment: 1. PPROM 2. Fetal Wellbeing: Category I  3. Pain Control: None 4. GBS: Pos. Allergy to PCN, Cephalosporins. Clinda and E-mycin resistant. 5. 40.4 week IUP  Plan:  1. Admit to BS per consult with MD 2. Routine L&D orders 3. Analgesia/anesthesia PRN  4. Vanc for GBS 5. Discussed Expectant management vs augmentation particularly considering GBS pos. Pt elects pitocin augmentation.   Dorathy Kinsman 05/06/2012, 12:49 AM

## 2012-05-06 NOTE — Progress Notes (Signed)
Orders given to restart Vanc at 63ml/hr.

## 2012-05-06 NOTE — Progress Notes (Signed)
Katherine Hooper is a 22 y.o. G2P1001 at [redacted]w[redacted]d.  Subjective: Comfortable w/ epidural.  Objective: BP 98/66  Pulse 107  Temp(Src) 98.1 F (36.7 C) (Oral)  Resp 18  Ht 5' 1.5" (1.562 m)  Wt 92.08 kg (203 lb)  BMI 37.74 kg/m2  SpO2 55%  LMP 07/26/2011      FHT:  FHR: 145 bpm, variability: moderate,  accelerations:  Present,  decelerations:  Present repetetive moderate variables despite position changes. Pitocin remains off.  UC:   regular, every 1.5-2.5 minutes, strong SVE:   Dilation: 7-8 Effacement (%): 90 Station: 0 Exam by:: v Mohammad Granade CNM IUPC placed w/out difficulty.  Labs: Lab Results  Component Value Date   WBC 6.8 05/05/2012   HGB 12.6 05/05/2012   HCT 37.7 05/05/2012   MCV 90.4 05/05/2012   PLT 158 05/05/2012    Assessment / Plan: Augmentation of labor, progressing well Tachy systole w/out pitocin  Labor: Progressing normally Preeclampsia:  NA Fetal Wellbeing:  Category II Pain Control:  Epidural I/D:  n/a Anticipated MOD:  NSVD Amnioinfusion started.   Katherine Hooper 05/06/2012, 4:36 AM

## 2012-05-06 NOTE — Anesthesia Procedure Notes (Signed)
Epidural Patient location during procedure: OB Start time: 05/06/2012 1:10 AM  Staffing Anesthesiologist: Deeanne Deininger A. Performed by: anesthesiologist   Preanesthetic Checklist Completed: patient identified, site marked, surgical consent, pre-op evaluation, timeout performed, IV checked, risks and benefits discussed and monitors and equipment checked  Epidural Patient position: sitting Prep: site prepped and draped and DuraPrep Patient monitoring: continuous pulse ox and blood pressure Approach: midline Injection technique: LOR air  Needle:  Needle type: Tuohy  Needle gauge: 17 G Needle length: 9 cm and 9 Needle insertion depth: 6 cm Catheter type: closed end flexible Catheter size: 19 Gauge Catheter at skin depth: 11 cm Test dose: negative and Other  Assessment Events: blood not aspirated, injection not painful, no injection resistance, negative IV test and no paresthesia  Additional Notes Patient identified. Risks and benefits discussed including failed block, incomplete  Pain control, post dural puncture headache, nerve damage, paralysis, blood pressure Changes, nausea, vomiting, reactions to medications-both toxic and allergic and post Partum back pain. All questions were answered. Patient expressed understanding and wished to proceed. Sterile technique was used throughout procedure. Epidural site was Dressed with sterile barrier dressing. No paresthesias, signs of intravascular injection Or signs of intrathecal spread were encountered.  Patient was more comfortable after the epidural was dosed. Please see RN's note for documentation of vital signs and FHR which are stable.

## 2012-05-06 NOTE — Progress Notes (Signed)
Ivonne Andrew CNM updated on pt feeling pressure in rectum.  Katrinka Blazing to report to bedside soon.

## 2012-05-06 NOTE — Progress Notes (Signed)
Ivonne Andrew CNM updated on UC pattern, FHR, and nursing interventions.  Smith CNM to report to pt room now.  Orders given to prep for IUPC.  Is assessing strip remotely now.

## 2012-05-06 NOTE — Progress Notes (Signed)
Patient ID: Katherine Hooper, female   DOB: 08/31/1990, 22 y.o.   MRN: [id number] Sx resolved w/ Benadryl. Requesting IV pain meds.  Dilation: 4 Effacement (%): 70 Station: -1 Presentation: Vertex Exam by:: V Matyas Baisley CNM  May have IV pain meds or epidural. Restart Vanc at 25 ml/hr.  [place], CNM 05/06/2012 12:40 AM

## 2012-05-06 NOTE — Progress Notes (Signed)
Ivonne Andrew CNM updated on UC pattern, FHR, and nursing interventions.  Smith reviewing strip remotely.  Orders to prepare IUPC.  Will report to pt bedside now.

## 2012-05-06 NOTE — Anesthesia Preprocedure Evaluation (Signed)
Anesthesia Evaluation  Patient identified by MRN, date of birth, ID band Patient awake    Reviewed: Allergy & Precautions, H&P , NPO status , Patient's Chart, lab work & pertinent test results  Airway Mallampati: III TM Distance: >3 FB Neck ROM: full    Dental no notable dental hx. (+) Teeth Intact   Pulmonary neg pulmonary ROS,  breath sounds clear to auscultation  Pulmonary exam normal       Cardiovascular negative cardio ROS  Rhythm:regular Rate:Normal     Neuro/Psych negative neurological ROS  negative psych ROS   GI/Hepatic Neg liver ROS, GERD-  Medicated and Controlled,  Endo/Other  Obesity  Renal/GU negative Renal ROS  negative genitourinary   Musculoskeletal   Abdominal Normal abdominal exam  (+)   Peds  Hematology negative hematology ROS (+) anemia ,   Anesthesia Other Findings   Reproductive/Obstetrics (+) Pregnancy                           Anesthesia Physical Anesthesia Plan  ASA: II  Anesthesia Plan: Epidural   Post-op Pain Management:    Induction:   Airway Management Planned:   Additional Equipment:   Intra-op Plan:   Post-operative Plan:   Informed Consent: I have reviewed the patients History and Physical, chart, labs and discussed the procedure including the risks, benefits and alternatives for the proposed anesthesia with the patient or authorized representative who has indicated his/her understanding and acceptance.     Plan Discussed with: Anesthesiologist  Anesthesia Plan Comments:         Anesthesia Quick Evaluation

## 2012-05-06 NOTE — Progress Notes (Signed)
Katherine Hooper is a 22 y.o. G2P1001 at [redacted]w[redacted]d. Called to [facility] by RN for pt thinking she may be having allergic reaction to vanc.   Subjective: Mild-Mod discomfort w/ UC's. C/O intense scalp pruritis, pruritis at IV site since start of Vancomycin. Denies SOB, chest pain, swelling of lips or tongue. generalized pruritis or rash. States she has never taken Vanc before.   Objective: BP 133/78  Pulse 95  Temp(Src) 97.5 F (36.4 C) (Oral)  Resp 20  Ht 5' 1.5" (1.562 m)  Wt 92.08 kg (203 lb)  BMI 37.74 kg/m2  SpO2 100%  LMP 07/26/2011 Patient Vitals for the past 24 hrs:  BP Temp Temp src Pulse Resp SpO2 Height Weight  05/06/12 0010 - - - 97 - 99 % - -  05/06/12 0005 - - - 92 - 100 % - -  05/06/12 0003 133/78 mmHg - - 95 20 - - -  05/06/12 0000 - - - 95 - 100 % - -  05/05/12 2355 - - - 98 - 100 % - -  05/05/12 2350 - - - 96 - 100 % - -  05/05/12 2345 - - - 89 - 100 % - -  05/05/12 2340 - - - 90 - 99 % - -  05/05/12 2336 - - - 90 - - - -  05/05/12 2335 128/79 mmHg 97.5 F (36.4 C) Oral 98 20 100 % - -  05/05/12 2330 - - - 93 - 98 % - -  05/05/12 2301 127/79 mmHg - - 97 18 - - -  05/05/12 2226 122/77 mmHg - - 90 18 - - -  05/05/12 2125 128/73 mmHg 98.2 F (36.8 C) Oral 101 18 - 5' 1.5" (1.562 m) 92.08 kg (203 lb)    FHT:  FHR: 140-150 bpm, variability: moderate,  accelerations:  Present,  decelerations:  Present ? few lates.  UC:   regular, every 2-5 minutes, modterate SVE:  Deferred HEENT: No swelling of lips or tongue Lungs: Normal resp effort, rate Skin: Two barely visible 2 cm hives proximal to IV infusion site. No erythema. No rash.  Labs: Lab Results  Component Value Date   WBC 6.8 05/05/2012   HGB 12.6 05/05/2012   HCT 37.7 05/05/2012   MCV 90.4 05/05/2012   PLT 158 05/05/2012    Assessment / Plan: 8 hour S/P ROM. Augmentation of labor Red Man Syndrome. Low suspicion of true allergic reaction due to first exposure,Sx.  Labor: Progressing normally Preeclampsia:   NA Fetal Wellbeing:  Category II Pain Control:  None I/D:  n/a Anticipated MOD:  NSVD Benadryl given. Monitor Sx, VS closely.  Discussed Sx w/ Pharmacist Jennette Kettle. Strongly suspect RMS, but acknowledges atypical Sx of hives at IV site. Recommends restarting Vanc at very slow rate. Dr Estanislado Pandy agrees. Recommends discussing situation w/ Neonatologist if pt cannot receive rest of dose.    Katherine Hooper 05/06/2012, 12:07 AM

## 2012-05-06 NOTE — Progress Notes (Signed)
Katherine Hooper is a 22 y.o. G2P1001 at [redacted]w[redacted]d.  Subjective: Comfortable w/ epidural.  Objective: BP 134/85  Pulse 103  Temp(Src) 98.1 F (36.7 C) (Oral)  Resp 20  Ht 5' 1.5" (1.562 m)  Wt 92.08 kg (203 lb)  BMI 37.74 kg/m2  SpO2 100%  LMP 07/26/2011      FHT:  FHR: 135 bpm, variability: moderate,  accelerations:  Present,  decelerations:  Present Repetetive mild-moderate variables. improved w/ D/C pitocin, position changes. UC:   regular, every 1.5- 2 minutes, tachysystoly on 6 miliUnits/min pitocin SVE:   Dilation: 6 Effacement (%): 90 Station: 0 Exam by:: v [employer] CNM  Labs: Lab Results  Component Value Date   WBC 6.8 05/05/2012   HGB 12.6 05/05/2012   HCT 37.7 05/05/2012   MCV 90.4 05/05/2012   PLT 158 05/05/2012    Assessment / Plan: Augmentation of labor, progressing well  Labor: Progressing on Pitocin, will leave pitocin off for now. Restart at half PRN.  Preeclampsia:  NA Fetal Wellbeing:  Category II Pain Control:  Epidural I/D:  n/a Anticipated MOD:  NSVD  [name] 05/06/2012, 3:07 AM

## 2012-05-06 NOTE — Progress Notes (Signed)
UR chart review completed.  

## 2012-05-06 NOTE — Anesthesia Postprocedure Evaluation (Signed)
  Anesthesia Post-op Note  Patient: Katherine Hooper  Procedure(s) Performed: * No procedures listed *  Patient Location: PACU and Mother/Baby  Anesthesia Type:Epidural  Level of Consciousness: awake, alert , oriented and patient cooperative  Airway and Oxygen Therapy: Patient Spontanous Breathing  Post-op Pain: none  Post-op Assessment: Post-op Vital signs reviewed and Patient's Cardiovascular Status Stable  Post-op Vital Signs: Reviewed and stable  Complications: No apparent anesthesia complications

## 2012-05-07 LAB — CBC
MCV: 90.1 fL (ref 78.0–100.0)
Platelets: 137 10*3/uL — ABNORMAL LOW (ref 150–400)
RBC: 3.84 MIL/uL — ABNORMAL LOW (ref 3.87–5.11)
WBC: 9.8 10*3/uL (ref 4.0–10.5)

## 2012-05-07 MED ORDER — MIDAZOLAM HCL 2 MG/2ML IJ SOLN
INTRAMUSCULAR | Status: AC
  Filled 2012-05-07: qty 2

## 2012-05-07 MED ORDER — FENTANYL CITRATE 0.05 MG/ML IJ SOLN
INTRAMUSCULAR | Status: AC
  Filled 2012-05-07: qty 2

## 2012-05-07 NOTE — Progress Notes (Signed)
Post Partum Day 1  Subjective: up ad lib and tolerating PO  Objective: Blood pressure 110/71, pulse 91, temperature 98 F (36.7 C), temperature source Oral, resp. rate 18, height 5' 1.5" (1.562 m), weight 203 lb (92.08 kg), last menstrual period 07/26/2011, SpO2 55.00%, unknown if currently breastfeeding.  Physical Exam:  General: no distress Lochia: appropriate Uterine Fundus: firm Incision: NA DVT Evaluation: No evidence of DVT seen on physical exam.   Recent Labs  05/05/12 2140 05/07/12 0515  HGB 12.6 11.6*  HCT 37.7 34.6*    Assessment/Plan: Plan for discharge tomorrow, Breastfeeding and Contraception Nexplanon Anemia   LOS: 2 days   Katherine Hooper V 05/07/2012, 12:08 PM

## 2012-05-08 MED ORDER — FERROUS SULFATE 325 (65 FE) MG PO TABS: 325.0000 mg | ORAL_TABLET | Freq: Two times a day (BID) | ORAL | Status: DC

## 2012-05-08 MED ORDER — MEDROXYPROGESTERONE ACETATE 150 MG/ML IM SUSP
150.0000 mg | Freq: Once | INTRAMUSCULAR | Status: AC
  Administered 2012-05-08: 150 mg via INTRAMUSCULAR
  Filled 2012-05-08: qty 1

## 2012-05-08 MED ORDER — IBUPROFEN 600 MG PO TABS: 600.0000 mg | ORAL_TABLET | Freq: Four times a day (QID) | ORAL | Status: DC

## 2012-05-08 NOTE — Discharge Summary (Signed)
Physician Discharge Summary  Patient ID: Katherine Hooper MRN: [id number] DOB/AGE: 12-14-1990 22 y.o.  Admit date: 05/05/2012 Discharge date: 05/08/2012  Admission Diagnoses: Term pregnancy, spontaneous rupture membranes  Discharge Diagnoses:  Principal Problem:   Normal vaginal delivery   Discharged Condition: stable  Hospital Course: The patient was admitted with ruptured membranes and went on to have a spontaneous vaginal delivery. Her postpartum course was uncomplicated. She was treated for positive group B strep. She plans an outpatient circumcision for her son. She plans to receive a single dose of Depo-Provera in the hospital and then to have a Nexplanon insertion for contraception,  subsequent to that  Consults: None  Significant Diagnostic Studies: labs:   Treatments: See above  Discharge Exam: Blood pressure 129/79, pulse 86, temperature 98.3 F (36.8 C), temperature source Oral, resp. rate 18, height 5' 1.5" (1.562 m), weight 203 lb (92.08 kg), last menstrual period 07/26/2011, SpO2 55.00%.Is currently breastfeeding. General appearance: alert, cooperative, appears stated age and no distress GI: soft, non-tender; bowel sounds normal; no masses,  no organomegaly. Uterus is firm Extremities: extremities normal, atraumatic, no cyanosis or edema  Disposition: 01-Home or Self Care   Future Appointments Provider Department Dept Phone   05/12/2012 10:30 AM [facility] [place] [facility] [phone]       Medication List    TAKE these medications       ibuprofen 600 MG tablet  Commonly known as:  ADVIL,MOTRIN  Take 1 tablet (600 mg total) by mouth every 6 (six) hours.     oxyCODONE-acetaminophen 5-325 MG per tablet  Commonly known as:  PERCOCET/ROXICET  Take 1-2 tablets by mouth every 4 (four) hours as needed for pain.     prenatal multivitamin Tabs  Take 1 tablet by mouth daily.     zolpidem 10 MG tablet  Commonly  known as:  AMBIEN  Take 1 tablet (10 mg total) by mouth at bedtime as needed for sleep.        infant was discharged home with mother. He was a 7 lbs. 1 oz. female infant with Apgars of 8 and 9.  SignedDierdre Forth P 05/08/2012, 9:45 AM

## 2012-05-12 ENCOUNTER — Ambulatory Visit (HOSPITAL_COMMUNITY): Admit: 2012-05-12 | Payer: Medicaid Other

## 2012-05-16 ENCOUNTER — Ambulatory Visit (HOSPITAL_COMMUNITY): Admission: RE | Admit: 2012-05-16 | Payer: Medicaid Other | Source: Ambulatory Visit

## 2012-05-16 ENCOUNTER — Other Ambulatory Visit: Payer: Self-pay | Admitting: Obstetrics and Gynecology

## 2012-05-16 MED ORDER — OXYCODONE-ACETAMINOPHEN 5-325 MG PO TABS: 1.0000 | ORAL_TABLET | ORAL | Status: DC | PRN

## 2012-05-21 ENCOUNTER — Ambulatory Visit (HOSPITAL_COMMUNITY): Payer: Medicaid Other

## 2012-06-02 ENCOUNTER — Telehealth: Payer: Self-pay | Admitting: Obstetrics and Gynecology

## 2012-06-02 NOTE — Telephone Encounter (Signed)
Spoke with pt rgd msg advised need eval before she can get a return to work note pt voice understanding

## 2012-06-03 ENCOUNTER — Telehealth: Payer: Self-pay

## 2012-06-03 NOTE — Telephone Encounter (Signed)
TC to pt regarding message from front dest requesting note  Pt PP Visit is 06-18-12 Pt needs note for daycare purposes. Pt does not have a voicemail set up.   [facility] CMA

## 2012-08-06 ENCOUNTER — Encounter (HOSPITAL_COMMUNITY): Payer: Self-pay | Admitting: Emergency Medicine

## 2012-08-06 ENCOUNTER — Emergency Department (HOSPITAL_COMMUNITY)
Admission: EM | Admit: 2012-08-06 | Discharge: 2012-08-06 | Disposition: A | Discharge status: Home / Self Care | Payer: Medicaid Other | Source: Home / Self Care | Attending: Family Medicine | Admitting: Family Medicine

## 2012-08-06 DIAGNOSIS — J020 Streptococcal pharyngitis: Secondary | ICD-10-CM

## 2012-08-06 MED ORDER — AZITHROMYCIN 250 MG PO TABS: ORAL_TABLET | ORAL | Status: DC

## 2012-08-06 NOTE — ED Provider Notes (Signed)
History     CSN: [id number]  Arrival date & time 08/06/12  1346   First MD Initiated Contact with Patient 08/06/12 1554      Chief Complaint  Patient presents with  . Sore Throat    (Consider location/radiation/quality/duration/timing/severity/associated sxs/prior treatment) Patient is a 22 y.o. female presenting with pharyngitis. The history is provided by the patient.  Sore Throat This is a new problem. The current episode started yesterday. The problem has been gradually worsening. The symptoms are aggravated by swallowing and smoking.    Past Medical History  Diagnosis Date  . Scoliosis   . Chlamydia   . Infection     BV x 2;currently on Flagyl for BV  . Group B streptococcal infection in pregnancy 2009  . Anemia     during last pregnancy  . Scoliosis   . GERD (gastroesophageal reflux disease)   . Abnormal Pap smear   . HPV (human papilloma virus) anogenital infection     Past Surgical History  Procedure Laterality Date  . No past surgeries      Family History  Problem Relation Age of Onset  . Other Neg Hx   . Hypertension Maternal Grandmother   . Hypertension Maternal Grandfather   . Hypertension Paternal Grandmother   . Hypertension Paternal Grandfather   . Diabetes Maternal Grandmother   . Diabetes Maternal Grandfather   . Asthma Maternal Grandmother   . Asthma Cousin     Maternal  . Stroke Maternal Grandfather   . Kidney disease Maternal Grandmother     Dialysis    History  Substance Use Topics  . Smoking status: Current Every Day Smoker -- 0.25 packs/day for 10 years    Types: Cigarettes    Last Attempt to Quit: 09/17/2011  . Smokeless tobacco: Never Used  . Alcohol Use: No    OB History   Grav Para Term Preterm Abortions TAB SAB Ect Mult Living   2 2 2  0 0 0 0 0 0 2      Review of Systems  Constitutional: Negative.  Negative for fever and chills.  HENT: Positive for sore throat.     Allergies  Penicillins  Home Medications    Current Outpatient Rx  Name  Route  Sig  Dispense  Refill  . azithromycin (ZITHROMAX Z-PAK) 250 MG tablet      Take as directed on pack   6 each   0   . ibuprofen (ADVIL,MOTRIN) 600 MG tablet   Oral   Take 1 tablet (600 mg total) by mouth every 6 (six) hours.   60 tablet   2   . oxyCODONE-acetaminophen (PERCOCET/ROXICET) 5-325 MG per tablet   Oral   Take 1-2 tablets by mouth every 4 (four) hours as needed for pain.   15 tablet   0   . Prenatal Vit-Fe Fumarate-FA (PRENATAL MULTIVITAMIN) TABS   Oral   Take 1 tablet by mouth daily.         Marland Kitchen EXPIRED: zolpidem (AMBIEN) 10 MG tablet   Oral   Take 1 tablet (10 mg total) by mouth at bedtime as needed for sleep.   15 tablet   0     BP 101/70  Pulse 88  Temp(Src) 100.1 F (37.8 C) (Oral)  Resp 16  SpO2 98%  LMP 08/06/2012  Breastfeeding? Unknown  Physical Exam  Nursing note and vitals reviewed. Constitutional: She is oriented to person, place, and time. She appears well-developed and well-nourished.  HENT:  Right Ear:  External ear normal.  Left Ear: External ear normal.  Mouth/Throat: Oropharyngeal exudate present.  Eyes: Pupils are equal, round, and reactive to light.  Neck: Normal range of motion. Neck supple.  Lymphadenopathy:    She has cervical adenopathy.  Neurological: She is alert and oriented to person, place, and time.  Skin: Skin is warm and dry.    ED Course  Procedures (including critical care time)  Labs Reviewed  POCT RAPID STREP A ([facility] ONLY) - Abnormal; Notable for the following:    Streptococcus, Group A Screen (Direct) POSITIVE (*)    All other components within normal limits   No results found.   1. Strep sore throat       MDM  Strep pos  Treated for strep pharyngitis.        Linna Hoff, MD 08/06/12 [phone]

## 2012-08-06 NOTE — ED Notes (Signed)
Pt c/o sore throat x 2 days. Could barely swallow and felt like neck was swollen on left side. Has a hx of strept throat. Has tried gargling with salt water with no relief. Patient is alert and oriented.

## 2013-04-27 ENCOUNTER — Encounter (HOSPITAL_COMMUNITY): Payer: Self-pay | Admitting: Emergency Medicine

## 2013-04-27 ENCOUNTER — Emergency Department (HOSPITAL_COMMUNITY)
Admission: EM | Admit: 2013-04-27 | Discharge: 2013-04-28 | Disposition: A | Discharge status: Home / Self Care | Payer: No Typology Code available for payment source | Attending: Emergency Medicine | Admitting: Emergency Medicine

## 2013-04-27 DIAGNOSIS — Y9389 Activity, other specified: Secondary | ICD-10-CM | POA: Insufficient documentation

## 2013-04-27 DIAGNOSIS — Z8719 Personal history of other diseases of the digestive system: Secondary | ICD-10-CM | POA: Insufficient documentation

## 2013-04-27 DIAGNOSIS — IMO0002 Reserved for concepts with insufficient information to code with codable children: Secondary | ICD-10-CM | POA: Diagnosis not present

## 2013-04-27 DIAGNOSIS — S0990XA Unspecified injury of head, initial encounter: Secondary | ICD-10-CM | POA: Diagnosis present

## 2013-04-27 DIAGNOSIS — F17200 Nicotine dependence, unspecified, uncomplicated: Secondary | ICD-10-CM | POA: Diagnosis not present

## 2013-04-27 DIAGNOSIS — Z8742 Personal history of other diseases of the female genital tract: Secondary | ICD-10-CM | POA: Insufficient documentation

## 2013-04-27 DIAGNOSIS — Z862 Personal history of diseases of the blood and blood-forming organs and certain disorders involving the immune mechanism: Secondary | ICD-10-CM | POA: Diagnosis not present

## 2013-04-27 DIAGNOSIS — S0083XA Contusion of other part of head, initial encounter: Secondary | ICD-10-CM

## 2013-04-27 DIAGNOSIS — Z8739 Personal history of other diseases of the musculoskeletal system and connective tissue: Secondary | ICD-10-CM | POA: Insufficient documentation

## 2013-04-27 DIAGNOSIS — S060X0A Concussion without loss of consciousness, initial encounter: Secondary | ICD-10-CM | POA: Insufficient documentation

## 2013-04-27 DIAGNOSIS — Z8619 Personal history of other infectious and parasitic diseases: Secondary | ICD-10-CM | POA: Insufficient documentation

## 2013-04-27 DIAGNOSIS — S79919A Unspecified injury of unspecified hip, initial encounter: Secondary | ICD-10-CM | POA: Insufficient documentation

## 2013-04-27 DIAGNOSIS — S298XXA Other specified injuries of thorax, initial encounter: Secondary | ICD-10-CM | POA: Insufficient documentation

## 2013-04-27 DIAGNOSIS — S060X9A Concussion with loss of consciousness of unspecified duration, initial encounter: Secondary | ICD-10-CM

## 2013-04-27 DIAGNOSIS — S1093XA Contusion of unspecified part of neck, initial encounter: Secondary | ICD-10-CM

## 2013-04-27 DIAGNOSIS — S79929A Unspecified injury of unspecified thigh, initial encounter: Secondary | ICD-10-CM

## 2013-04-27 DIAGNOSIS — S0003XA Contusion of scalp, initial encounter: Secondary | ICD-10-CM | POA: Insufficient documentation

## 2013-04-27 DIAGNOSIS — S060XAA Concussion with loss of consciousness status unknown, initial encounter: Secondary | ICD-10-CM

## 2013-04-27 DIAGNOSIS — Z880 Allergy status to penicillin: Secondary | ICD-10-CM | POA: Insufficient documentation

## 2013-04-27 DIAGNOSIS — Y929 Unspecified place or not applicable: Secondary | ICD-10-CM | POA: Insufficient documentation

## 2013-04-27 DIAGNOSIS — T07XXXA Unspecified multiple injuries, initial encounter: Secondary | ICD-10-CM
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
## 2013-04-28 ENCOUNTER — Emergency Department (HOSPITAL_COMMUNITY): Payer: No Typology Code available for payment source

## 2013-04-28 DIAGNOSIS — S060X0A Concussion without loss of consciousness, initial encounter: Secondary | ICD-10-CM | POA: Diagnosis not present

## 2013-04-28 LAB — POCT I-STAT, CHEM 8
BUN: 12 mg/dL (ref 6–23)
CALCIUM ION: 1.19 mmol/L (ref 1.12–1.23)
Chloride: 104 mEq/L (ref 96–112)
Creatinine, Ser: 0.6 mg/dL (ref 0.50–1.10)
GLUCOSE: 91 mg/dL (ref 70–99)
HEMATOCRIT: 41 % (ref 36.0–46.0)
HEMOGLOBIN: 13.9 g/dL (ref 12.0–15.0)
Potassium: 3 mEq/L — ABNORMAL LOW (ref 3.7–5.3)
Sodium: 141 mEq/L (ref 137–147)
TCO2: 23 mmol/L (ref 0–100)

## 2013-04-28 LAB — CBC
HEMATOCRIT: 38 % (ref 36.0–46.0)
Hemoglobin: 13.1 g/dL (ref 12.0–15.0)
MCH: 30 pg (ref 26.0–34.0)
MCHC: 34.5 g/dL (ref 30.0–36.0)
MCV: 87.2 fL (ref 78.0–100.0)
PLATELETS: 187 10*3/uL (ref 150–400)
RBC: 4.36 MIL/uL (ref 3.87–5.11)
RDW: 12.8 % (ref 11.5–15.5)
WBC: 5.9 10*3/uL (ref 4.0–10.5)

## 2013-04-28 MED ORDER — ONDANSETRON HCL 4 MG/2ML IJ SOLN
4.0000 mg | Freq: Once | INTRAMUSCULAR | Status: AC
  Administered 2013-04-28: 4 mg via INTRAVENOUS
  Filled 2013-04-28: qty 2

## 2013-04-28 MED ORDER — IBUPROFEN 800 MG PO TABS: 800.0000 mg | ORAL_TABLET | Freq: Three times a day (TID) | ORAL | Status: DC

## 2013-04-28 MED ORDER — POTASSIUM CHLORIDE CRYS ER 20 MEQ PO TBCR
40.0000 meq | EXTENDED_RELEASE_TABLET | Freq: Once | ORAL | Status: AC
  Administered 2013-04-28: 40 meq via ORAL
  Filled 2013-04-28: qty 2

## 2013-04-28 MED ORDER — CYCLOBENZAPRINE HCL 10 MG PO TABS: 10.0000 mg | ORAL_TABLET | Freq: Two times a day (BID) | ORAL | Status: DC | PRN

## 2013-04-28 MED ORDER — FENTANYL CITRATE 0.05 MG/ML IJ SOLN
50.0000 ug | INTRAMUSCULAR | Status: DC | PRN
  Administered 2013-04-28 (×3): 50 ug via INTRAVENOUS
  Filled 2013-04-28 (×3): qty 2

## 2013-04-28 MED ORDER — OXYCODONE-ACETAMINOPHEN 5-325 MG PO TABS: 1.0000 | ORAL_TABLET | ORAL | Status: DC | PRN
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
## 2013-04-28 NOTE — ED Notes (Signed)
Pt reports pain mostly on her left side, concentrating in her shoulder and hip. Pt also reports pain in her neck, with an inability to move without pain. Pt placed in a C-Collar.

## 2013-04-28 NOTE — ED Notes (Signed)
Patient tolerated ambulating

## 2013-04-28 NOTE — ED Notes (Signed)
MD at Bedside.

## 2013-04-28 NOTE — ED Notes (Signed)
Patient requested and received a cup of Sprite.

## 2013-04-28 NOTE — Discharge Instructions (Signed)

## 2013-12-02 ENCOUNTER — Emergency Department (INDEPENDENT_AMBULATORY_CARE_PROVIDER_SITE_OTHER)
Admission: EM | Admit: 2013-12-02 | Discharge: 2013-12-02 | Disposition: A | Discharge status: Home / Self Care | Payer: Self-pay | Source: Home / Self Care | Attending: Emergency Medicine | Admitting: Emergency Medicine

## 2013-12-02 ENCOUNTER — Encounter (HOSPITAL_COMMUNITY): Payer: Self-pay | Admitting: Emergency Medicine

## 2013-12-02 DIAGNOSIS — J020 Streptococcal pharyngitis: Secondary | ICD-10-CM

## 2013-12-02 LAB — POCT RAPID STREP A: Streptococcus, Group A Screen (Direct): POSITIVE — AB

## 2013-12-02 MED ORDER — AZITHROMYCIN 500 MG PO TABS: 500.0000 mg | ORAL_TABLET | Freq: Every day | ORAL | Status: DC

## 2013-12-02 NOTE — ED Provider Notes (Signed)
  Chief Complaint   Sore Throat   History of Present Illness   Katherine Hooper is a 23 year old female who's had a two-day history of sore throat, pain on swallowing, with pain radiating towards both ears. She's had a slight cough but no fever, chills, headache, nasal congestion, rhinorrhea, stiff neck, swollen glands, or GI symptoms. She has no known exposure to strep in the recent past, but she has had a personal history of strep throat many times in the past, and she feels that this is like her usual strep throat.   Review of Systems   Other than as noted above, the patient denies any of the following symptoms. Systemic:  No fever, chills, sweats, myalgias, or headache. Eye:  No redness, pain or drainage. ENT:  No earache, nasal congestion, sneezing, rhinorrhea, sinus pressure, sinus pain, or post nasal drip. Lungs:  No cough, sputum production, wheezing, shortness of breath, or chest pain. GI:  No abdominal pain, nausea, vomiting, or diarrhea. Skin:  No rash.  PMFSH   Past medical history, family history, social history, meds, and allergies were reviewed. She is allergic to penicillin.  Physical Exam     Vital signs:  BP 114/74  Pulse 105  Temp(Src) 99.1 F (37.3 C) (Oral)  Resp 14  SpO2 98% General:  Alert, in no distress. Phonation was normal, no drooling, and patient was able to handle secretions well.  Eye:  No conjunctival injection or drainage. Lids were normal. ENT:  TMs and canals were normal, without erythema or inflammation.  Nasal mucosa was clear and uncongested, without drainage.  Mucous membranes were moist.  Exam of pharynx reveals no erythema, no swelling, no exudate.  There were no oral ulcerations or lesions. There was no bulging of the tonsillar pillars, and the uvula was midline. Neck:  Supple, no adenopathy, tenderness or mass. Lungs:  No respiratory distress.  Lungs were clear to auscultation, without wheezes, rales or rhonchi.  Breath sounds were clear  and equal bilaterally.  Heart:  Regular rhythm, without gallops, murmers or rubs. Skin:  Clear, warm, and dry, without rash or lesions.  Labs   Results for orders placed during the hospital encounter of 12/02/13  POCT RAPID STREP A ([facility] ONLY)      Result Value Ref Range   Streptococcus, Group A Screen (Direct) POSITIVE (*) NEGATIVE   Assessment   The encounter diagnosis was Strep throat.  There is no evidence of a peritonsillar abscess, retropharyngeal abscess, or epiglottitis.  Plan     1.  Meds:  The following meds were prescribed:   New Prescriptions   AZITHROMYCIN (ZITHROMAX) 500 MG TABLET    Take 1 tablet (500 mg total) by mouth daily.    2.  Patient Education/Counseling:  The patient was given appropriate handouts, self care instructions, and instructed in symptomatic relief, including hot saline gargles, throat lozenges, infectious precautions, and need to trade out toothbrush.    3.  Follow up:  The patient was told to follow up here if no better in 3 to 4 days, or sooner if becoming worse in any way, and given some red flag symptoms such as difficulty swallowing or breathing which would prompt immediate return.      Reuben Likes, MD 12/02/13 [phone]

## 2013-12-02 NOTE — ED Notes (Signed)
C/o sore throat x 2 days.

## 2013-12-02 NOTE — Discharge Instructions (Signed)

## 2014-01-18 ENCOUNTER — Encounter (HOSPITAL_COMMUNITY): Payer: Self-pay | Admitting: Emergency Medicine

## 2014-03-01 ENCOUNTER — Emergency Department (HOSPITAL_COMMUNITY): Payer: No Typology Code available for payment source

## 2014-03-01 ENCOUNTER — Emergency Department (HOSPITAL_COMMUNITY)
Admission: EM | Admit: 2014-03-01 | Discharge: 2014-03-01 | Disposition: A | Discharge status: Home / Self Care | Payer: No Typology Code available for payment source | Attending: Emergency Medicine | Admitting: Emergency Medicine

## 2014-03-01 ENCOUNTER — Encounter (HOSPITAL_COMMUNITY): Payer: Self-pay

## 2014-03-01 DIAGNOSIS — Z792 Long term (current) use of antibiotics: Secondary | ICD-10-CM | POA: Diagnosis not present

## 2014-03-01 DIAGNOSIS — S8992XA Unspecified injury of left lower leg, initial encounter: Secondary | ICD-10-CM | POA: Diagnosis present

## 2014-03-01 DIAGNOSIS — Z8739 Personal history of other diseases of the musculoskeletal system and connective tissue: Secondary | ICD-10-CM | POA: Insufficient documentation

## 2014-03-01 DIAGNOSIS — Z791 Long term (current) use of non-steroidal anti-inflammatories (NSAID): Secondary | ICD-10-CM | POA: Diagnosis not present

## 2014-03-01 DIAGNOSIS — Z880 Allergy status to penicillin: Secondary | ICD-10-CM | POA: Insufficient documentation

## 2014-03-01 DIAGNOSIS — Y998 Other external cause status: Secondary | ICD-10-CM | POA: Diagnosis not present

## 2014-03-01 DIAGNOSIS — Z720 Tobacco use: Secondary | ICD-10-CM | POA: Insufficient documentation

## 2014-03-01 DIAGNOSIS — Z79899 Other long term (current) drug therapy: Secondary | ICD-10-CM | POA: Diagnosis not present

## 2014-03-01 DIAGNOSIS — Z8619 Personal history of other infectious and parasitic diseases: Secondary | ICD-10-CM | POA: Insufficient documentation

## 2014-03-01 DIAGNOSIS — Z8719 Personal history of other diseases of the digestive system: Secondary | ICD-10-CM | POA: Diagnosis not present

## 2014-03-01 DIAGNOSIS — S40212A Abrasion of left shoulder, initial encounter: Secondary | ICD-10-CM | POA: Insufficient documentation

## 2014-03-01 DIAGNOSIS — Z862 Personal history of diseases of the blood and blood-forming organs and certain disorders involving the immune mechanism: Secondary | ICD-10-CM | POA: Insufficient documentation

## 2014-03-01 DIAGNOSIS — Y92410 Unspecified street and highway as the place of occurrence of the external cause: Secondary | ICD-10-CM | POA: Diagnosis not present

## 2014-03-01 DIAGNOSIS — Y9389 Activity, other specified: Secondary | ICD-10-CM | POA: Insufficient documentation

## 2014-03-01 MED ORDER — CYCLOBENZAPRINE HCL 10 MG PO TABS: 10.0000 mg | ORAL_TABLET | Freq: Two times a day (BID) | ORAL | Status: DC | PRN

## 2014-03-01 MED ORDER — CYCLOBENZAPRINE HCL 10 MG PO TABS
10.0000 mg | ORAL_TABLET | Freq: Once | ORAL | Status: AC
  Administered 2014-03-01: 10 mg via ORAL
  Filled 2014-03-01 (×2): qty 1

## 2014-03-01 MED ORDER — HYDROCODONE-ACETAMINOPHEN 5-325 MG PO TABS: 2.0000 | ORAL_TABLET | ORAL | Status: DC | PRN

## 2014-03-01 MED ORDER — HYDROCODONE-ACETAMINOPHEN 5-325 MG PO TABS
2.0000 | ORAL_TABLET | Freq: Once | ORAL | Status: AC
  Administered 2014-03-01: 2 via ORAL
  Filled 2014-03-01: qty 2

## 2014-03-01 NOTE — Discharge Instructions (Signed)
Take vicodin as needed for pain. Take flexeril as needed for muscle spasm. You may take these medications together. Refer to attached documents for more information.  °
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
## 2014-03-01 NOTE — ED Notes (Signed)
Pt returned from xray
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
## 2014-03-03 ENCOUNTER — Emergency Department (HOSPITAL_COMMUNITY)
Admission: EM | Admit: 2014-03-03 | Discharge: 2014-03-03 | Disposition: A | Discharge status: Home / Self Care | Payer: No Typology Code available for payment source | Attending: Emergency Medicine | Admitting: Emergency Medicine

## 2014-03-03 ENCOUNTER — Emergency Department (HOSPITAL_COMMUNITY): Payer: No Typology Code available for payment source

## 2014-03-03 ENCOUNTER — Encounter (HOSPITAL_COMMUNITY): Payer: Self-pay

## 2014-03-03 DIAGNOSIS — R52 Pain, unspecified: Secondary | ICD-10-CM

## 2014-03-03 DIAGNOSIS — Z880 Allergy status to penicillin: Secondary | ICD-10-CM | POA: Diagnosis not present

## 2014-03-03 DIAGNOSIS — Z3202 Encounter for pregnancy test, result negative: Secondary | ICD-10-CM | POA: Diagnosis not present

## 2014-03-03 DIAGNOSIS — Z8719 Personal history of other diseases of the digestive system: Secondary | ICD-10-CM | POA: Diagnosis not present

## 2014-03-03 DIAGNOSIS — Y998 Other external cause status: Secondary | ICD-10-CM | POA: Insufficient documentation

## 2014-03-03 DIAGNOSIS — Z79899 Other long term (current) drug therapy: Secondary | ICD-10-CM | POA: Insufficient documentation

## 2014-03-03 DIAGNOSIS — Z8619 Personal history of other infectious and parasitic diseases: Secondary | ICD-10-CM | POA: Insufficient documentation

## 2014-03-03 DIAGNOSIS — M419 Scoliosis, unspecified: Secondary | ICD-10-CM | POA: Insufficient documentation

## 2014-03-03 DIAGNOSIS — Y92410 Unspecified street and highway as the place of occurrence of the external cause: Secondary | ICD-10-CM | POA: Insufficient documentation

## 2014-03-03 DIAGNOSIS — Z720 Tobacco use: Secondary | ICD-10-CM | POA: Insufficient documentation

## 2014-03-03 DIAGNOSIS — S161XXA Strain of muscle, fascia and tendon at neck level, initial encounter: Secondary | ICD-10-CM | POA: Diagnosis not present

## 2014-03-03 DIAGNOSIS — Y9389 Activity, other specified: Secondary | ICD-10-CM | POA: Insufficient documentation

## 2014-03-03 DIAGNOSIS — S199XXA Unspecified injury of neck, initial encounter: Secondary | ICD-10-CM | POA: Diagnosis present

## 2014-03-03 LAB — POC URINE PREG, ED: PREG TEST UR: NEGATIVE

## 2014-03-03 MED ORDER — IBUPROFEN 400 MG PO TABS
800.0000 mg | ORAL_TABLET | Freq: Once | ORAL | Status: AC
  Administered 2014-03-03: 800 mg via ORAL
  Filled 2014-03-03: qty 4
  Filled 2014-03-03: qty 2

## 2014-03-03 MED ORDER — IBUPROFEN 800 MG PO TABS: 800.0000 mg | ORAL_TABLET | Freq: Three times a day (TID) | ORAL | Status: DC
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
## 2014-09-22 ENCOUNTER — Encounter (HOSPITAL_COMMUNITY): Payer: Self-pay | Admitting: Emergency Medicine

## 2014-09-22 ENCOUNTER — Emergency Department (HOSPITAL_COMMUNITY)
Admission: EM | Admit: 2014-09-22 | Discharge: 2014-09-22 | Disposition: A | Discharge status: Home / Self Care | Payer: Self-pay | Attending: Emergency Medicine | Admitting: Emergency Medicine

## 2014-09-22 DIAGNOSIS — Z880 Allergy status to penicillin: Secondary | ICD-10-CM | POA: Insufficient documentation

## 2014-09-22 DIAGNOSIS — Z792 Long term (current) use of antibiotics: Secondary | ICD-10-CM | POA: Insufficient documentation

## 2014-09-22 DIAGNOSIS — R21 Rash and other nonspecific skin eruption: Secondary | ICD-10-CM | POA: Insufficient documentation

## 2014-09-22 DIAGNOSIS — Z862 Personal history of diseases of the blood and blood-forming organs and certain disorders involving the immune mechanism: Secondary | ICD-10-CM | POA: Insufficient documentation

## 2014-09-22 DIAGNOSIS — Z8619 Personal history of other infectious and parasitic diseases: Secondary | ICD-10-CM | POA: Insufficient documentation

## 2014-09-22 DIAGNOSIS — M419 Scoliosis, unspecified: Secondary | ICD-10-CM | POA: Insufficient documentation

## 2014-09-22 DIAGNOSIS — Z8719 Personal history of other diseases of the digestive system: Secondary | ICD-10-CM | POA: Insufficient documentation

## 2014-09-22 DIAGNOSIS — Z720 Tobacco use: Secondary | ICD-10-CM | POA: Insufficient documentation
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
## 2014-09-22 NOTE — ED Provider Notes (Signed)
CSN: [id number]     Arrival date & time 09/22/14  1113 History   First MD Initiated Contact with Patient 09/22/14 1118     Chief Complaint  Patient presents with  . Rash     (Consider location/radiation/quality/duration/timing/severity/associated sxs/prior Treatment) HPI Comments: Pt comes in with c/o intermittent rash over the last couple of days when she wakes up in the morning. Denies fever. Pt states that it is there when she wakes up but it leaves when she gets out. No new medications or detergents. She states that her lips were swollen this morning. Denies problems breathing or swallowing. States that the symptoms have resolved today.   The history is provided by the patient. No language interpreter was used.    Past Medical History  Diagnosis Date  . Scoliosis   . Chlamydia   . Infection     BV x 2;currently on Flagyl for BV  . Group B streptococcal infection in pregnancy 2009  . Anemia     during last pregnancy  . Scoliosis   . GERD (gastroesophageal reflux disease)   . Abnormal Pap smear   . HPV (human papilloma virus) anogenital infection    Past Surgical History  Procedure Laterality Date  . No past surgeries     Family History  Problem Relation Age of Onset  . Other Neg Hx   . Hypertension Maternal Grandmother   . Hypertension Maternal Grandfather   . Hypertension Paternal Grandmother   . Hypertension Paternal Grandfather   . Diabetes Maternal Grandmother   . Diabetes Maternal Grandfather   . Asthma Maternal Grandmother   . Asthma Cousin     Maternal  . Stroke Maternal Grandfather   . Kidney disease Maternal Grandmother     Dialysis   History  Substance Use Topics  . Smoking status: Current Every Day Smoker -- 0.25 packs/day for 10 years    Types: Cigarettes    Last Attempt to Quit: 09/17/2011  . Smokeless tobacco: Never Used  . Alcohol Use: No   OB History    Gravida Para Term Preterm AB TAB SAB Ectopic Multiple Living   0 0 0 0 0 0 2      Review of Systems  All other systems reviewed and are negative.     Allergies  Penicillins  Home Medications   Prior to Admission medications   Medication Sig Start Date End Date Taking? Authorizing Provider  azithromycin (ZITHROMAX) 500 MG tablet Take 1 tablet (500 mg total) by mouth daily. 12/02/13   Reuben Likes, MD  cyclobenzaprine (FLEXERIL) 10 MG tablet Take 1 tablet (10 mg total) by mouth 2 (two) times daily as needed for muscle spasms. 03/01/14   Emilia Beck, PA-C  HYDROcodone-acetaminophen (NORCO/VICODIN) 5-325 MG per tablet Take 2 tablets by mouth every 4 (four) hours as needed for moderate pain or severe pain. 03/01/14   Kaitlyn Szekalski, PA-C  ibuprofen (ADVIL,MOTRIN) 800 MG tablet Take 1 tablet (800 mg total) by mouth 3 (three) times daily. 03/03/14   Ladona Mow, PA-C  oxyCODONE-acetaminophen (PERCOCET/ROXICET) 5-325 MG per tablet Take 1 tablet by mouth every 4 (four) hours as needed for severe pain. 04/28/13   [name], MD   BP 120/81 mmHg  Pulse 87  Temp(Src) 98.3 F (36.8 C) (Oral)  Resp 17  SpO2 100%  LMP 09/22/2014 (Approximate)  Breastfeeding? No Physical Exam  Constitutional: She is oriented to person, place, and time. She appears well-developed and well-nourished.  HENT:  Head:  Normocephalic and atraumatic.  Right Ear: External ear normal.  Left Ear: External ear normal.  Mouth/Throat: Oropharynx is clear and moist.  Eyes: Conjunctivae and EOM are normal.  Cardiovascular: Normal rate and regular rhythm.   Pulmonary/Chest: Effort normal.  Musculoskeletal: Normal range of motion.  Neurological: She is alert and oriented to person, place, and time.  Skin: Skin is warm and dry. No rash noted.  Psychiatric: She has a normal mood and affect.  Nursing note and vitals reviewed.   ED Course  Procedures (including critical care time) Labs Review Labs Reviewed - No data to display  Imaging Review No results found.   EKG Interpretation None       MDM   Final diagnoses:  Rash    Discussed use of benadryl. Pt is not on any bp medication. No rash noted at this time. Discussed changing things one at a time and seeing if symptoms resolve. Pt given return precautions    Teressa LowerVrinda Yoko Mcgahee, NP 09/22/14 1138  Raeford RazorStephen Kohut, MD 09/23/14 [phone]

## 2014-09-22 NOTE — Discharge Instructions (Signed)
Take benadryl needed as discussed Contact Dermatitis Contact dermatitis is a reaction to certain substances that touch the skin. Contact dermatitis can be either irritant contact dermatitis or allergic contact dermatitis. Irritant contact dermatitis does not require previous exposure to the substance for a reaction to occur.Allergic contact dermatitis only occurs if you have been exposed to the substance before. Upon a repeat exposure, your body reacts to the substance.  CAUSES  Many substances can cause contact dermatitis. Irritant dermatitis is most commonly caused by repeated exposure to mildly irritating substances, such as:  Makeup.  Soaps.  Detergents.  Bleaches.  Acids.  Metal salts, such as nickel. Allergic contact dermatitis is most commonly caused by exposure to:  Poisonous plants.  Chemicals (deodorants, shampoos).  Jewelry.  Latex.  Neomycin in triple antibiotic cream.  Preservatives in products, including clothing. SYMPTOMS  The area of skin that is exposed may develop:  Dryness or flaking.  Redness.  Cracks.  Itching.  Pain or a burning sensation.  Blisters. With allergic contact dermatitis, there may also be swelling in areas such as the eyelids, mouth, or genitals.  DIAGNOSIS  Your caregiver can usually tell what the problem is by doing a physical exam. In cases where the cause is uncertain and an allergic contact dermatitis is suspected, a patch skin test may be performed to help determine the cause of your dermatitis. TREATMENT Treatment includes protecting the skin from further contact with the irritating substance by avoiding that substance if possible. Barrier creams, powders, and gloves may be helpful. Your caregiver may also recommend:  Steroid creams or ointments applied 2 times daily. For best results, soak the rash area in cool water for 20 minutes. Then apply the medicine. Cover the area with a plastic wrap. You can store the steroid cream  in the refrigerator for a "chilly" effect on your rash. That may decrease itching. Oral steroid medicines may be needed in more severe cases.  Antibiotics or antibacterial ointments if a skin infection is present.  Antihistamine lotion or an antihistamine taken by mouth to ease itching.  Lubricants to keep moisture in your skin.  Burow's solution to reduce redness and soreness or to dry a weeping rash. Mix one packet or tablet of solution in 2 cups cool water. Dip a clean washcloth in the mixture, wring it out a bit, and put it on the affected area. Leave the cloth in place for 30 minutes. Do this as often as possible throughout the day.  Taking several cornstarch or baking soda baths daily if the area is too large to cover with a washcloth. Harsh chemicals, such as alkalis or acids, can cause skin damage that is like a burn. You should flush your skin for 15 to 20 minutes with cold water after such an exposure. You should also seek immediate medical care after exposure. Bandages (dressings), antibiotics, and pain medicine may be needed for severely irritated skin.  HOME CARE INSTRUCTIONS  Avoid the substance that caused your reaction.  Keep the area of skin that is affected away from hot water, soap, sunlight, chemicals, acidic substances, or anything else that would irritate your skin.  Do not scratch the rash. Scratching may cause the rash to become infected.  You may take cool baths to help stop the itching.  Only take over-the-counter or prescription medicines as directed by your caregiver.  See your caregiver for follow-up care as directed to make sure your skin is healing properly. SEEK MEDICAL CARE IF:   Your condition  is not better after 3 days of treatment.  You seem to be getting worse.  You see signs of infection such as swelling, tenderness, redness, soreness, or warmth in the affected area.  You have any problems related to your medicines. Document Released: 03/02/2000  Document Revised: 05/28/2011 Document Reviewed: 08/08/2010 [facility] Patient Information 2015 [place], [place]. This information is not intended to replace advice given to you by your health care provider. Make sure you discuss any questions you have with your health care provider.

## 2014-09-22 NOTE — ED Notes (Signed)
Pt escorted to discharge window. Pt verbalized understanding discharge instructions. In no acute distress.  

## 2015-03-24 ENCOUNTER — Emergency Department (HOSPITAL_COMMUNITY)
Admission: EM | Admit: 2015-03-24 | Discharge: 2015-03-24 | Disposition: A | Discharge status: Home / Self Care | Payer: Medicaid Other | Attending: Emergency Medicine | Admitting: Emergency Medicine

## 2015-03-24 ENCOUNTER — Encounter (HOSPITAL_COMMUNITY): Payer: Self-pay | Admitting: Emergency Medicine

## 2015-03-24 DIAGNOSIS — Z8619 Personal history of other infectious and parasitic diseases: Secondary | ICD-10-CM | POA: Diagnosis not present

## 2015-03-24 DIAGNOSIS — Z880 Allergy status to penicillin: Secondary | ICD-10-CM | POA: Diagnosis not present

## 2015-03-24 DIAGNOSIS — R1030 Lower abdominal pain, unspecified: Secondary | ICD-10-CM | POA: Diagnosis present

## 2015-03-24 DIAGNOSIS — M419 Scoliosis, unspecified: Secondary | ICD-10-CM | POA: Insufficient documentation

## 2015-03-24 DIAGNOSIS — Z8719 Personal history of other diseases of the digestive system: Secondary | ICD-10-CM | POA: Insufficient documentation

## 2015-03-24 DIAGNOSIS — L03314 Cellulitis of groin: Secondary | ICD-10-CM | POA: Insufficient documentation

## 2015-03-24 DIAGNOSIS — L02214 Cutaneous abscess of groin: Secondary | ICD-10-CM

## 2015-03-24 DIAGNOSIS — F17210 Nicotine dependence, cigarettes, uncomplicated: Secondary | ICD-10-CM | POA: Diagnosis not present

## 2015-03-24 MED ORDER — LIDOCAINE HCL 2 % IJ SOLN
20.0000 mL | Freq: Once | INTRAMUSCULAR | Status: AC
  Administered 2015-03-24: 400 mg
  Filled 2015-03-24: qty 20

## 2015-03-24 MED ORDER — IBUPROFEN 800 MG PO TABS: 800.0000 mg | ORAL_TABLET | Freq: Three times a day (TID) | ORAL | Status: DC | PRN

## 2015-03-24 NOTE — ED Notes (Signed)
Bed: WTR7 Expected date:  Expected time:  Means of arrival:  Comments: Phlebotomy 

## 2015-03-24 NOTE — Discharge Instructions (Signed)
Return here in 2 days for recheck.  Keep the area clean and dry. °

## 2015-03-24 NOTE — ED Notes (Signed)
PA at bedside for US

## 2015-03-24 NOTE — ED Provider Notes (Signed)
CSN: [id number]     Arrival date & time 1/5/17161096045  [id number] History   First MD Initiated Contact with Patient 03/24/15 [phone]     Chief Complaint  Patient presents with  . Groin Pain     (Consider location/radiation/quality/duration/timing/severity/associated sxs/prior Treatment) HPI  Patient presents to the emergency department with an indurated area in her right bikini line. Patient explains that after shaving one week ago she felt a "lump" in her right bikini line that progressed in size and is causing ehr sharp pain with movement. She denies discharge, erythema, burning, blood at site. This has occurred before in her left axilla and resulted in an abscess. Patient denies fever, fatigue, weight change, HA, cough, SOB, abdominal pain, N/V, dysuria.  Past Medical History  Diagnosis Date  . Scoliosis   . Chlamydia   . Infection     BV x 2;currently on Flagyl for BV  . Group B streptococcal infection in pregnancy 2009  . Anemia     during last pregnancy  . Scoliosis   . GERD (gastroesophageal reflux disease)   . Abnormal Pap smear   . HPV (human papilloma virus) anogenital infection    Past Surgical History  Procedure Laterality Date  . No past surgeries     Family History  Problem Relation Age of Onset  . Other Neg Hx   . Hypertension Maternal Grandmother   . Hypertension Maternal Grandfather   . Hypertension Paternal Grandmother   . Hypertension Paternal Grandfather   . Diabetes Maternal Grandmother   . Diabetes Maternal Grandfather   . Asthma Maternal Grandmother   . Asthma Cousin     Maternal  . Stroke Maternal Grandfather   . Kidney disease Maternal Grandmother     Dialysis   Social History  Substance Use Topics  . Smoking status: Current Every Day Smoker -- 0.25 packs/day for 10 years    Types: Cigarettes    Last Attempt to Quit: 09/17/2011  . Smokeless tobacco: Never Used  . Alcohol Use: No   OB History    Gravida Para Term Preterm AB TAB SAB Ectopic Multiple  Living   2 2 2  0 0 0 0 0 0 2     Review of Systems  Constitutional: Negative for fever, chills, fatigue and unexpected weight change.  HENT: Negative for congestion, mouth sores and sore throat.   Respiratory: Negative for cough and shortness of breath.   Cardiovascular: Negative for chest pain.  Gastrointestinal: Negative for nausea, vomiting, abdominal pain and blood in stool.  Genitourinary: Negative for dysuria, hematuria, vaginal bleeding, vaginal discharge and menstrual problem.  Musculoskeletal: Negative for myalgias.  Neurological: Negative for dizziness, syncope, weakness and headaches.  Hematological: Negative for adenopathy.      Allergies  Penicillins  Home Medications   Prior to Admission medications   Medication Sig Start Date End Date Taking? Authorizing Provider  ibuprofen (ADVIL,MOTRIN) 200 MG tablet Take 400 mg by mouth every 6 (six) hours as needed for headache, mild pain or moderate pain.   Yes Historical Provider, MD   BP 110/66 mmHg  Pulse 104  Temp(Src) 98.3 F (36.8 C) (Oral)  Resp 16  Ht 5\' 2"  (1.575 m)  Wt 81.647 kg  BMI 32.91 kg/m2  SpO2 97%  LMP 03/11/2015 (Exact Date) Physical Exam  Constitutional: She appears well-developed and well-nourished. No distress.  HENT:  Mouth/Throat: Oropharynx is clear and moist.  Cardiovascular: Normal rate and regular rhythm.  Exam reveals no gallop and no friction rub.  No murmur heard. Pulmonary/Chest: Breath sounds normal.  Abdominal: There is no tenderness.  Skin: Skin is warm and dry. She is not diaphoretic.  Approximate 2 cm indurated, nonerythematous mass on right lower aspect of vulva. Nonfluxuant, cool. TTP    ED Course  Procedures (including critical care time) Labs Review Labs Reviewed - No data to display  Imaging Review No results found. I have personally reviewed and evaluated these images and lab results as part of my medical decision-making.  Patient has a small abscess, left groin  abscesses drained.  Patient is advised to return here in 2 days for recheck  INCISION AND DRAINAGE Performed by: Carlyle Dolly Consent: Verbal consent obtained. Risks and benefits: risks, benefits and alternatives were discussed Type: abscess  Body area: Right groin inguinal region  Anesthesia: local infiltration  Incision was made with a scalpel.  Local anesthetic: lidocaine 2 % without epinephrine  Anesthetic total: 5 ml  Complexity: complex Blunt dissection to break up loculations  Drainage: purulent  Drainage amount: Moderate   Packing material: 1/4 in iodoform gauze  Patient tolerance: Patient tolerated the procedure well with no immediate complications.      Charlestine Night, PA-C 03/24/15 0817  Charlestine Night, PA-C 03/24/15 4010  Linwood Dibbles, MD 03/24/15 [phone]

## 2015-03-24 NOTE — ED Notes (Signed)
Pt states she has a bump on her right groin area that she noticed a week ago  Pt states it hurts when her underware rub against it  Pt states it has gotten a little bigger since last week  No drainage noted

## 2016-03-19 NOTE — L&D Delivery Note (Signed)
Delivery Note At 6:04 AM a viable female was delivered via Vaginal, Spontaneous (Presentation: ROA).  APGAR: 9, 9; weight pending.   Placenta status: Spontaneous, intact.  Cord: 3 vessels with the following complications: body x2.  Anesthesia:  none Episiotomy: None Lacerations:  1st degree, hemodynamically stable Suture Repair: not repaired Est. Blood Loss (mL):  50  Mom to postpartum.  Baby to Couplet care / Skin to Skin.  Rolm BookbinderCaroline M Dyson Sevey CNM 02/26/2017, 6:18 AM  Please schedule this patient for Postpartum visit in: 4 weeks with the following provider: Any provider For C/S patients schedule nurse incision check in weeks 2 weeks: no Low risk pregnancy complicated by: n/a Delivery mode:  SVD Anticipated Birth Control:  IUD PP Procedures needed: n/a  Schedule Integrated BH visit: no

## 2016-07-19 ENCOUNTER — Ambulatory Visit (INDEPENDENT_AMBULATORY_CARE_PROVIDER_SITE_OTHER): Payer: Self-pay | Admitting: General Practice

## 2016-07-19 ENCOUNTER — Encounter: Payer: Self-pay | Admitting: Family Medicine

## 2016-07-19 DIAGNOSIS — Z3201 Encounter for pregnancy test, result positive: Secondary | ICD-10-CM

## 2016-07-19 LAB — POCT PREGNANCY, URINE: PREG TEST UR: POSITIVE — AB

## 2016-07-19 NOTE — Progress Notes (Signed)
Patient here for upt. UPT +. Patient had first positive home test yesterday. Reports LMP 06/05/16. EDD 03/12/17 5432w2d today. Patient reports no medications or vitamins at this time. Recommended she begin PNV. Told patient she may begin prenatal care & can get an appt here in June. Patient verbalized understanding and had no questions
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
## 2016-08-30 ENCOUNTER — Encounter: Payer: Self-pay | Admitting: Medical

## 2016-08-30 ENCOUNTER — Ambulatory Visit (INDEPENDENT_AMBULATORY_CARE_PROVIDER_SITE_OTHER): Payer: Medicaid Other | Admitting: Medical

## 2016-08-30 ENCOUNTER — Other Ambulatory Visit (HOSPITAL_COMMUNITY)
Admission: RE | Admit: 2016-08-30 | Discharge: 2016-08-30 | Disposition: A | Discharge status: Home / Self Care | Payer: Medicaid Other | Source: Ambulatory Visit | Attending: Medical | Admitting: Medical

## 2016-08-30 VITALS — BP 116/73 | HR 93 | Wt 177.9 lb

## 2016-08-30 DIAGNOSIS — O219 Vomiting of pregnancy, unspecified: Secondary | ICD-10-CM

## 2016-08-30 DIAGNOSIS — Z3480 Encounter for supervision of other normal pregnancy, unspecified trimester: Secondary | ICD-10-CM

## 2016-08-30 DIAGNOSIS — O26891 Other specified pregnancy related conditions, first trimester: Secondary | ICD-10-CM | POA: Insufficient documentation

## 2016-08-30 DIAGNOSIS — R12 Heartburn: Secondary | ICD-10-CM

## 2016-08-30 DIAGNOSIS — Z124 Encounter for screening for malignant neoplasm of cervix: Secondary | ICD-10-CM

## 2016-08-30 DIAGNOSIS — Z113 Encounter for screening for infections with a predominantly sexual mode of transmission: Secondary | ICD-10-CM | POA: Diagnosis not present

## 2016-08-30 DIAGNOSIS — Z3481 Encounter for supervision of other normal pregnancy, first trimester: Secondary | ICD-10-CM | POA: Diagnosis not present

## 2016-08-30 MED ORDER — FAMOTIDINE 20 MG PO TABS: 20.0000 mg | ORAL_TABLET | Freq: Two times a day (BID) | ORAL | 2 refills | Status: DC

## 2016-08-30 MED ORDER — PROMETHAZINE HCL 12.5 MG PO TABS: 12.5000 mg | ORAL_TABLET | Freq: Four times a day (QID) | ORAL | 0 refills | Status: DC | PRN

## 2016-08-30 NOTE — Patient Instructions (Addendum)
First Trimester of Pregnancy The first trimester of pregnancy is from week 1 until the end of week 13 (months 1 through 3). During this time, your baby will begin to develop inside you. At 6-8 weeks, the eyes and face are formed, and the heartbeat can be seen on ultrasound. At the end of 12 weeks, all the baby's organs are formed. Prenatal care is all the medical care you receive before the birth of your baby. Make sure you get good prenatal care and follow all of your doctor's instructions. Follow these instructions at home: Medicines  Take over-the-counter and prescription medicines only as told by your doctor. Some medicines are safe and some medicines are not safe during pregnancy.  Take a prenatal vitamin that contains at least 600 micrograms (mcg) of folic acid.  If you have trouble pooping (constipation), take medicine that will make your stool soft (stool softener) if your doctor approves. Eating and drinking  Eat regular, healthy meals.  Your doctor will tell you the amount of weight gain that is right for you.  Avoid raw meat and uncooked cheese.  If you feel sick to your stomach (nauseous) or throw up (vomit): ? Eat 4 or 5 small meals a day instead of 3 large meals. ? Try eating a few soda crackers. ? Drink liquids between meals instead of during meals.  To prevent constipation: ? Eat foods that are high in fiber, like fresh fruits and vegetables, whole grains, and beans. ? Drink enough fluids to keep your pee (urine) clear or pale yellow. Activity  Exercise only as told by your doctor. Stop exercising if you have cramps or pain in your lower belly (abdomen) or low back.  Do not exercise if it is too hot, too humid, or if you are in a place of great height (high altitude).  Try to avoid standing for long periods of time. Move your legs often if you must stand in one place for a long time.  Avoid heavy lifting.  Wear low-heeled shoes. Sit and stand up straight.  You  can have sex unless your doctor tells you not to. Relieving pain and discomfort  Wear a good support bra if your breasts are sore.  Take warm water baths (sitz baths) to soothe pain or discomfort caused by hemorrhoids. Use hemorrhoid cream if your doctor says it is okay.  Rest with your legs raised if you have leg cramps or low back pain.  If you have puffy, bulging veins (varicose veins) in your legs: ? Wear support hose or compression stockings as told by your doctor. ? Raise (elevate) your feet for 15 minutes, 3-4 times a day. ? Limit salt in your food. Prenatal care  Schedule your prenatal visits by the twelfth week of pregnancy.  Write down your questions. Take them to your prenatal visits.  Keep all your prenatal visits as told by your doctor. This is important. Safety  Wear your seat belt at all times when driving.  Make a list of emergency phone numbers. The list should include numbers for family, friends, the hospital, and police and fire departments. General instructions  Ask your doctor for a referral to a local prenatal class. Begin classes no later than at the start of month 6 of your pregnancy.  Ask for help if you need counseling or if you need help with nutrition. Your doctor can give you advice or tell you where to go for help.  Do not use hot tubs, steam rooms, or   saunas.  Do not douche or use tampons or scented sanitary pads.  Do not cross your legs for long periods of time.  Avoid all herbs and alcohol. Avoid drugs that are not approved by your doctor.  Do not use any tobacco products, including cigarettes, chewing tobacco, and electronic cigarettes. If you need help quitting, ask your doctor. You may get counseling or other support to help you quit.  Avoid cat litter boxes and soil used by cats. These carry germs that can cause birth defects in the baby and can cause a loss of your baby (miscarriage) or stillbirth.  Visit your dentist. At home, brush  your teeth with a soft toothbrush. Be gentle when you floss. Contact a doctor if:  You are dizzy.  You have mild cramps or pressure in your lower belly.  You have a nagging pain in your belly area.  You continue to feel sick to your stomach, you throw up, or you have watery poop (diarrhea).  You have a bad smelling fluid coming from your vagina.  You have pain when you pee (urinate).  You have increased puffiness (swelling) in your face, hands, legs, or ankles. Get help right away if:  You have a fever.  You are leaking fluid from your vagina.  You have spotting or bleeding from your vagina.  You have very bad belly cramping or pain.  You gain or lose weight rapidly.  You throw up blood. It may look like coffee grounds.  You are around people who have [place] measles, fifth disease, or chickenpox.  You have a very bad headache.  You have shortness of breath.  You have any kind of trauma, such as from a fall or a car accident. Summary  The first trimester of pregnancy is from week 1 until the end of week 13 (months 1 through 3).  To take care of yourself and your unborn baby, you will need to eat healthy meals, take medicines only if your doctor tells you to do so, and do activities that are safe for you and your baby.  Keep all follow-up visits as told by your doctor. This is important as your doctor will have to ensure that your baby is healthy and growing well. This information is not intended to replace advice given to you by your health care provider. Make sure you discuss any questions you have with your health care provider. Document Released: 08/22/2007 Document Revised: 03/13/2016 Document Reviewed: 03/13/2016 Elsevier Interactive Patient Education  2017 [employer].   For the nausea you can take an over the counter medication known as UNISOM in combination with VITAMIN B6 50mg  twice a day. This is the same as a prescription medication known as Diclegis, and  is the only known safe medication to take in early pregnancy. There are other medications available if this does now work. However, those medications are not known to be as safe.   Eating Plan for Hyperemesis Gravidarum Hyperemesis gravidarum is a severe form of morning sickness. Because this condition causes severe nausea and vomiting, it can lead to dehydration, malnutrition, and weight loss. One way to lessen the symptoms of nausea and vomiting is to follow the eating plan for hyperemesis gravidarum. It is often used along with prescribed medicines to control your symptoms. What can I do to relieve my symptoms? Listen to your body. Everyone is different and has different preferences. Find what works best for you. Take any of the following actions that are helpful to you:  Eat and drink slowly.  Eat 5-6 small meals daily instead of 3 large meals.  Eat crackers before you get out of bed in the morning.  Try having a snack in the middle of the night.  Starchy foods are usually tolerated well. Examples include cereal, toast, bread, potatoes, pasta, rice, and pretzels.  Ginger may help with nausea. Add  tsp ground ginger to hot tea or choose ginger tea.  Try drinking 100% fruit juice or an electrolyte drink. An electrolyte drink contains sodium, potassium, and chloride.  Continue to take your prenatal vitamins as told by your health care provider. If you are having trouble taking your prenatal vitamins, talk with your health care provider about different options.  Include at least 1 serving of protein with your meals and snacks. Protein options include meats or poultry, beans, nuts, eggs, and yogurt. Try eating a protein-rich snack before bed. Examples of these snacks include cheese and crackers or half of a peanut butter or [place] sandwich.  Consider eliminating foods that trigger your symptoms. These may include spicy foods, coffee, high-fat foods, very sweet foods, and acidic foods.  Try  meals that have more protein combined with bland, salty, lower-fat, and dry foods, such as nuts, seeds, pretzels, crackers, and cereal.  Talk with your healthcare provider about starting a supplement of vitamin B6.  Have fluids that are cold, clear, and carbonated or sour. Examples include lemonade, ginger ale, lemon-lime soda, ice water, and sparkling water.  Try lemon or mint tea.  Try brushing your teeth or using a mouth rinse after meals.  What should I avoid to reduce my symptoms? Avoiding some of the following things may help reduce your symptoms.  Foods with strong smells. Try eating meals in well-ventilated areas that are free of odors.  Drinking water or other beverages with meals. Try not to drink anything during the 30 minutes before and after your meals.  Drinking more than 1 cup of fluid at a time. Sometimes using a straw helps.  Fried or high-fat foods, such as butter and cream sauces.  Spicy foods.  Skipping meals as best as you can. Nausea can be more intense on an empty stomach. If you cannot tolerate food at that time, do not force it. Try sucking on ice chips or other frozen items, and make up for missed calories later.  Lying down within 2 hours after eating.  Environmental triggers. These may include smoky rooms, closed spaces, rooms with strong smells, warm or humid places, overly loud and noisy rooms, and rooms with motion or flickering lights.  Quick and sudden changes in your movement.  This information is not intended to replace advice given to you by your health care provider. Make sure you discuss any questions you have with your health care provider. Document Released: 12/31/2006 Document Revised: 11/02/2015 Document Reviewed: 10/04/2015 Elsevier Interactive Patient Education  [employer]2018 Elsevier Inc.

## 2016-08-30 NOTE — Progress Notes (Signed)
   PRENATAL VISIT NOTE  Subjective:  Katherine Hooper is a 26 y.o. G3P2002 at 40109w0d being seen today for her initial prenatal care visit.  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  Patient reports heartburn, nausea and vomiting.  Contractions: Not present. Vag. Bleeding: None.  Movement: Absent. Denies leaking of fluid.   The following portions of the patient's history were reviewed and updated as appropriate: allergies, current medications, past family history, past medical history, past social history, past surgical history and problem list. Problem list updated.  Objective:   Vitals:   08/30/16 0951  BP: 116/73  Pulse: 93  Weight: 177 lb 14.4 oz (80.7 kg)    Fetal Status: Fetal Heart Rate (bpm): 164   Movement: Absent     General:  Alert, oriented and cooperative. Patient is in no acute distress.  Skin: Skin is warm and dry. No rash noted.   Cardiovascular: Normal heart rate and rhythm noted  Respiratory: Normal respiratory effort, no problems with respiration noted, normal breath sounds  Abdomen: Soft, non-tender, gravid, appropriate for gestational age. Pain/Pressure: Absent     Pelvic:  Cervical exam performed Dilation: Closed Effacement (%): Thick    Cervix with normal contour, no lesions. Moderate amount of thin, white discharge with odor. No bleeding. Wet prep and pap smear obtained.    Extremities: Normal range of motion.  Edema: None  Mental Status: Normal mood and affect. Normal behavior. Normal judgment and thought content.   Assessment and Plan:  Pregnancy: G3P2002 at 96109w0d  1. Supervision of other normal pregnancy, antepartum - Too late for First Screen, Quad screen at next visit  2. Nausea and vomiting in pregnancy prior to [redacted] weeks gestation - promethazine (PHENERGAN) 12.5 MG  tablet; Take 1 tablet (12.5 mg total) by mouth every 6 (six) hours as needed for nausea or vomiting.  Dispense: 30 tablet; Refill: 0 - Instructions for OTC B6 and Unisom given - Hyperemesis diet discussed and included on AVS  3. Heartburn in pregnancy in first trimester - famotidine (PEPCID) 20 MG tablet; Take 1 tablet (20 mg total) by mouth 2 (two) times daily.  Dispense: 60 tablet; Refill: 2  first trimester warning symptoms and general obstetric precautions including but not limited to vaginal bleeding, contractions, leaking of fluid and fetal movement were reviewed in detail with the patient. Please refer to After Visit Summary for other counseling recommendations.  Return in about 4 weeks (around 09/27/2016) for LOB.   Vonzella NippleJulie Wenzel, PA-C

## 2016-08-31 LAB — PRENATAL PROFILE I(LABCORP)
Antibody Screen: NEGATIVE
Basophils Absolute: 0 10*3/uL (ref 0.0–0.2)
Basos: 0 %
EOS (ABSOLUTE): 0 10*3/uL (ref 0.0–0.4)
Eos: 1 %
HEMOGLOBIN: 13.3 g/dL (ref 11.1–15.9)
Hematocrit: 39.4 % (ref 34.0–46.6)
Hepatitis B Surface Ag: NEGATIVE
Immature Grans (Abs): 0 10*3/uL (ref 0.0–0.1)
Immature Granulocytes: 0 %
LYMPHS ABS: 1.5 10*3/uL (ref 0.7–3.1)
LYMPHS: 26 %
MCH: 29.7 pg (ref 26.6–33.0)
MCHC: 33.8 g/dL (ref 31.5–35.7)
MCV: 88 fL (ref 79–97)
MONOS ABS: 0.4 10*3/uL (ref 0.1–0.9)
Monocytes: 7 %
NEUTROS PCT: 66 %
Neutrophils Absolute: 3.7 10*3/uL (ref 1.4–7.0)
PLATELETS: 216 10*3/uL (ref 150–379)
RBC: 4.48 x10E6/uL (ref 3.77–5.28)
RDW: 13 % (ref 12.3–15.4)
RPR Ser Ql: NONREACTIVE
Rh Factor: POSITIVE
Rubella Antibodies, IGG: 6.34 index (ref >0.99)
WBC: 5.7 10*3/uL (ref 3.4–10.8)

## 2016-08-31 LAB — CERVICOVAGINAL ANCILLARY ONLY
Bacterial vaginitis: POSITIVE — AB
CANDIDA VAGINITIS: NEGATIVE
Chlamydia: NEGATIVE
Neisseria Gonorrhea: NEGATIVE
Trichomonas: NEGATIVE

## 2016-08-31 LAB — HIV ANTIBODY (ROUTINE TESTING W REFLEX): HIV SCREEN 4TH GENERATION: NONREACTIVE

## 2016-09-01 LAB — CULTURE, OB URINE

## 2016-09-01 LAB — URINE CULTURE, OB REFLEX: ORGANISM ID, BACTERIA: NO GROWTH

## 2016-09-03 ENCOUNTER — Other Ambulatory Visit: Payer: Self-pay | Admitting: Medical

## 2016-09-03 DIAGNOSIS — B9689 Other specified bacterial agents as the cause of diseases classified elsewhere: Secondary | ICD-10-CM

## 2016-09-03 DIAGNOSIS — N760 Acute vaginitis: Principal | ICD-10-CM

## 2016-09-03 MED ORDER — METRONIDAZOLE 500 MG PO TABS: 500.0000 mg | ORAL_TABLET | Freq: Two times a day (BID) | ORAL | 0 refills | Status: DC

## 2016-09-04 ENCOUNTER — Telehealth: Payer: Self-pay | Admitting: *Deleted

## 2016-09-04 LAB — CYTOLOGY - PAP: Diagnosis: NEGATIVE

## 2016-09-04 NOTE — Telephone Encounter (Addendum)
-----   Message from Marny LowensteinJulie N Wenzel, PA-C sent at 09/03/2016 11:55 AM EDT ----- Patient has BV. Rx for Flagyl sent to patients pharmacy. Please inform patient of dx and rx.   Thanks!  Raynelle FanningJulie  6/19  0825  Called pt and informed her of test results showing +BV and treatment has been prescribed. Pt voiced understanding and had no questions.

## 2016-09-25 ENCOUNTER — Ambulatory Visit (INDEPENDENT_AMBULATORY_CARE_PROVIDER_SITE_OTHER): Payer: Medicaid Other | Admitting: Obstetrics and Gynecology

## 2016-09-25 VITALS — BP 122/76 | HR 76 | Wt 184.5 lb

## 2016-09-25 DIAGNOSIS — Z3480 Encounter for supervision of other normal pregnancy, unspecified trimester: Secondary | ICD-10-CM

## 2016-09-25 DIAGNOSIS — Z3482 Encounter for supervision of other normal pregnancy, second trimester: Secondary | ICD-10-CM

## 2016-09-25 DIAGNOSIS — O219 Vomiting of pregnancy, unspecified: Secondary | ICD-10-CM | POA: Insufficient documentation

## 2016-09-25 MED ORDER — ONDANSETRON 8 MG PO TBDP
8.0000 mg | ORAL_TABLET | Freq: Three times a day (TID) | ORAL | 0 refills | Status: DC | PRN
Start: 2016-09-25 — End: 2017-02-28

## 2016-09-25 NOTE — Progress Notes (Signed)
   PRENATAL VISIT NOTE  Subjective:  Katherine Hooper is a 26 y.o. G3P2002 at 987w5d being seen today for ongoing prenatal care.  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  Patient reports nausea and vomiting. States she is vomiting 2 times on average everyday.  Contractions: Not present.  .  Movement: Absent. Denies leaking of fluid.   The following portions of the patient's history were reviewed and updated as appropriate: allergies, current medications, past family history, past medical history, past social history, past surgical history and problem list. Problem list updated.  Objective:   Vitals:   09/25/16 1252  BP: 122/76  Pulse: 76  Weight: 184 lb 8 oz (83.7 kg)    Fetal Status:     Movement: Absent     General:  Alert, oriented and cooperative. Patient is in no acute distress.  Skin: Skin is warm and dry. No rash noted.   Cardiovascular: Normal heart rate noted  Respiratory: Normal respiratory effort, no problems with respiration noted  Abdomen: Soft, gravid, appropriate for gestational age. Pain/Pressure: Absent     Pelvic:  Cervical exam deferred        Extremities: Normal range of motion.  Edema: None  Mental Status: Normal mood and affect. Normal behavior. Normal judgment and thought content.   Assessment and Plan:  Pregnancy: G3P2002 at 8987w5d  1. Supervision of other normal pregnancy, antepartum  - US MFM OB COMP + 14 WK; Future - AFP TETRA  2. Nausea and vomiting in pregnancy prior to [redacted] weeks gestation  Rx: Zofran, instructed to take in the AM.  Phenergan at night, patient has RX.    Preterm labor symptoms and general obstetric precautions including but not limited to vaginal bleeding, contractions, leaking of fluid  and fetal movement were reviewed in detail with the patient. Please refer to After Visit Summary for other counseling recommendations.  No Follow-up on file.  Joyce Heitman, Harolyn RutherfordJennifer I, NP

## 2016-09-29 LAB — AFP TETRA
DIA Mom Value: 1.09
DIA Value (EIA): 164.31 pg/mL
DSR (By Age)    1 IN: 905
DSR (Second Trimester) 1 IN: 4758
Gestational Age: 16 WEEKS
MSAFP MOM: 1.02
MSAFP: 32.2 ng/mL
MSHCG MOM: 1.1
MSHCG: 37503 m[IU]/mL
Maternal Age At EDD: 27.3 yr
Osb Risk: 10000
T18 (By Age): 1:3525 {titer}
Test Results:: NEGATIVE
UE3 VALUE: 0.75 ng/mL
Weight: 184 [lb_av]
uE3 Mom: 1.04

## 2016-10-15 ENCOUNTER — Ambulatory Visit (HOSPITAL_COMMUNITY)
Admission: RE | Admit: 2016-10-15 | Discharge: 2016-10-15 | Disposition: A | Discharge status: Home / Self Care | Payer: Medicaid Other | Source: Ambulatory Visit | Attending: Obstetrics and Gynecology | Admitting: Obstetrics and Gynecology

## 2016-10-15 ENCOUNTER — Other Ambulatory Visit: Payer: Self-pay | Admitting: Obstetrics and Gynecology

## 2016-10-15 DIAGNOSIS — Z3A19 19 weeks gestation of pregnancy: Secondary | ICD-10-CM | POA: Diagnosis not present

## 2016-10-15 DIAGNOSIS — Z3482 Encounter for supervision of other normal pregnancy, second trimester: Secondary | ICD-10-CM | POA: Diagnosis present

## 2016-10-15 DIAGNOSIS — Z3480 Encounter for supervision of other normal pregnancy, unspecified trimester: Secondary | ICD-10-CM

## 2016-10-22 ENCOUNTER — Ambulatory Visit (INDEPENDENT_AMBULATORY_CARE_PROVIDER_SITE_OTHER): Payer: Medicaid Other | Admitting: Student

## 2016-10-22 VITALS — BP 117/76 | HR 84 | Wt 186.1 lb

## 2016-10-22 DIAGNOSIS — Z3400 Encounter for supervision of normal first pregnancy, unspecified trimester: Secondary | ICD-10-CM

## 2016-10-22 DIAGNOSIS — Z0489 Encounter for examination and observation for other specified reasons: Secondary | ICD-10-CM

## 2016-10-22 DIAGNOSIS — Z3482 Encounter for supervision of other normal pregnancy, second trimester: Secondary | ICD-10-CM

## 2016-10-22 DIAGNOSIS — O219 Vomiting of pregnancy, unspecified: Secondary | ICD-10-CM

## 2016-10-22 DIAGNOSIS — IMO0002 Reserved for concepts with insufficient information to code with codable children: Secondary | ICD-10-CM

## 2016-10-22 DIAGNOSIS — Z048 Encounter for examination and observation for other specified reasons: Secondary | ICD-10-CM

## 2016-10-22 DIAGNOSIS — Z3480 Encounter for supervision of other normal pregnancy, unspecified trimester: Secondary | ICD-10-CM

## 2016-10-22 NOTE — Progress Notes (Signed)
Educated pt on Skin to Skin  OB f/u US scheduled for September 4th @ 0845.  Pt notified.

## 2016-10-23 DIAGNOSIS — IMO0002 Reserved for concepts with insufficient information to code with codable children: Secondary | ICD-10-CM | POA: Insufficient documentation

## 2016-10-23 DIAGNOSIS — Z0489 Encounter for examination and observation for other specified reasons: Secondary | ICD-10-CM | POA: Insufficient documentation

## 2016-10-23 NOTE — Progress Notes (Signed)
   PRENATAL VISIT NOTE  Subjective:  Katherine Hooper is a 26 y.o. G3P2002 at 6785w5d being seen today for ongoing prenatal care.  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  Patient reports still having a hard time eating and drinking. Finds that she can only keep down salad and other cold foods; also finds that she cannot eat anything acidic. She is not taking any of her anti-emetics becuase she was taking them with her prenatal vitaimen and felt like "all those pills were making her throw up".  Contractions: Not present. Vag. Bleeding: None.  Movement: Present. Denies leaking of fluid.   The following portions of the patient's history were reviewed and updated as appropriate: allergies, current medications, past family history, past medical history, past social history, past surgical history and problem list. Problem list updated.  Objective:   Vitals:   10/22/16 0910  BP: 117/76  Pulse: 84  Weight: 186 lb 1.6 oz (84.4 kg)    Fetal Status: Fetal Heart Rate (bpm): 152 Fundal Height: 21 cm Movement: Present     General:  Alert, oriented and cooperative. Patient is in no acute distress.  Skin: Skin is warm and dry. No rash noted.   Cardiovascular: Normal heart rate noted  Respiratory: Normal respiratory effort, no problems with respiration noted  Abdomen: Soft, gravid, appropriate for gestational age.  Pain/Pressure: Present     Pelvic: Cervical exam deferred        Extremities: Normal range of motion.  Edema: None  Mental Status:  Normal mood and affect. Normal behavior. Normal judgment and thought content.   Assessment and Plan:  Pregnancy: G3P2002 at 4185w5d  1. Evaluate anatomy not seen on prior  sonogram - US MFM OB FOLLOW UP; Future  2. Supervision of normal first pregnancy, antepartum  - US MFM OB FOLLOW UP; Future  3. Supervision of other normal pregnancy, antepartum   4. Nausea and vomiting in pregnancy prior to [redacted] weeks gestation Discussed in detail the importance of taking her anti-emetics and her acid reflux medicine. Patient understands the importance of separating the anti-emetics from her meals. I encouraged her to eat high protein foods, rather than simple sugars or carbs as carbs will not help her feel full and can contribute to her feelings of sluggishness. Patient verbalized understanding.   Preterm labor symptoms and general obstetric precautions including but not limited to vaginal bleeding, contractions, leaking of fluid and fetal movement were reviewed in detail with the patient. Please refer to After Visit Summary for other counseling recommendations.  Return in about 4 weeks (around 11/19/2016).   Marylene LandKathryn Lorraine Anicia Leuthold, CNM

## 2016-10-24 ENCOUNTER — Telehealth: Payer: Self-pay | Admitting: General Practice

## 2016-10-24 NOTE — Telephone Encounter (Signed)
Called and left message on VM in regards to follow up OB appointment on 11/26/16 at 9:00am.  Asked patient to give our office a call if unable to keep this appointment.

## 2016-11-20 ENCOUNTER — Ambulatory Visit (HOSPITAL_COMMUNITY)
Admission: RE | Admit: 2016-11-20 | Discharge: 2016-11-20 | Disposition: A | Discharge status: Home / Self Care | Payer: Medicaid Other | Source: Ambulatory Visit | Attending: Student | Admitting: Student

## 2016-11-20 DIAGNOSIS — Z3A24 24 weeks gestation of pregnancy: Secondary | ICD-10-CM | POA: Diagnosis not present

## 2016-11-20 DIAGNOSIS — Z362 Encounter for other antenatal screening follow-up: Secondary | ICD-10-CM | POA: Insufficient documentation

## 2016-11-20 DIAGNOSIS — O99212 Obesity complicating pregnancy, second trimester: Secondary | ICD-10-CM | POA: Diagnosis not present

## 2016-11-20 DIAGNOSIS — Z048 Encounter for examination and observation for other specified reasons: Secondary | ICD-10-CM | POA: Insufficient documentation

## 2016-11-20 DIAGNOSIS — IMO0002 Reserved for concepts with insufficient information to code with codable children: Secondary | ICD-10-CM

## 2016-11-20 DIAGNOSIS — Z3400 Encounter for supervision of normal first pregnancy, unspecified trimester: Secondary | ICD-10-CM

## 2016-11-20 DIAGNOSIS — Z0489 Encounter for examination and observation for other specified reasons: Secondary | ICD-10-CM

## 2016-11-26 ENCOUNTER — Encounter: Payer: Medicaid Other | Admitting: Medical

## 2016-11-26 ENCOUNTER — Ambulatory Visit (INDEPENDENT_AMBULATORY_CARE_PROVIDER_SITE_OTHER): Payer: Medicaid Other | Admitting: Advanced Practice Midwife

## 2016-11-26 DIAGNOSIS — Z3482 Encounter for supervision of other normal pregnancy, second trimester: Secondary | ICD-10-CM

## 2016-11-26 DIAGNOSIS — Z3480 Encounter for supervision of other normal pregnancy, unspecified trimester: Secondary | ICD-10-CM

## 2016-11-26 NOTE — Patient Instructions (Addendum)
Tdap Vaccine (Tetanus, Diphtheria and Pertussis): What You Need to Know 1. Why get vaccinated? Tetanus, diphtheria and pertussis are very serious diseases. Tdap vaccine can protect us from these diseases. And, Tdap vaccine given to pregnant women can protect newborn babies against pertussis. TETANUS (Lockjaw) is rare in the [place] today. It causes painful muscle tightening and stiffness, usually all over the body.  It can lead to tightening of muscles in the head and neck so you can't open your mouth, swallow, or sometimes even breathe. Tetanus kills about 1 out of 10 people who are infected even after receiving the best medical care.  DIPHTHERIA is also rare in the [place] today. It can cause a thick coating to form in the back of the throat.  It can lead to breathing problems, heart failure, paralysis, and death.  PERTUSSIS (Whooping Cough) causes severe coughing spells, which can cause difficulty breathing, vomiting and disturbed sleep.  It can also lead to weight loss, incontinence, and rib fractures. Up to 2 in 100 adolescents and 5 in 100 adults with pertussis are hospitalized or have complications, which could include pneumonia or death.  These diseases are caused by bacteria. Diphtheria and pertussis are spread from person to person through secretions from coughing or sneezing. Tetanus enters the body through cuts, scratches, or wounds. Before vaccines, as many as 200,000 cases of diphtheria, 200,000 cases of pertussis, and hundreds of cases of tetanus, were reported in the [place] each year. Since vaccination began, reports of cases for tetanus and diphtheria have dropped by about 99% and for pertussis by about 80%. 2. Tdap vaccine Tdap vaccine can protect adolescents and adults from tetanus, diphtheria, and pertussis. One dose of Tdap is routinely given at age 11 or 12. People who did not get Tdap at that age should get it as soon as possible. Tdap is especially  important for healthcare professionals and anyone having close contact with a baby younger than 12 months. Pregnant women should get a dose of Tdap during every pregnancy, to protect the newborn from pertussis. Infants are most at risk for severe, life-threatening complications from pertussis. Another vaccine, called Td, protects against tetanus and diphtheria, but not pertussis. A Td booster should be given every 10 years. Tdap may be given as one of these boosters if you have never gotten Tdap before. Tdap may also be given after a severe cut or burn to prevent tetanus infection. Your doctor or the person giving you the vaccine can give you more information. Tdap may safely be given at the same time as other vaccines. 3. Some people should not get this vaccine  A person who has ever had a life-threatening allergic reaction after a previous dose of any diphtheria, tetanus or pertussis containing vaccine, OR has a severe allergy to any part of this vaccine, should not get Tdap vaccine. Tell the person giving the vaccine about any severe allergies.  Anyone who had coma or long repeated seizures within 7 days after a childhood dose of DTP or DTaP, or a previous dose of Tdap, should not get Tdap, unless a cause other than the vaccine was found. They can still get Td.  Talk to your doctor if you: ? have seizures or another nervous system problem, ? had severe pain or swelling after any vaccine containing diphtheria, tetanus or pertussis, ? ever had a condition called Guillain-Barr Syndrome (GBS), ? aren't feeling well on the day the shot is scheduled. 4. Risks With any medicine, including   vaccines, there is a chance of side effects. These are usually mild and go away on their own. Serious reactions are also possible but are rare. Most people who get Tdap vaccine do not have any problems with it. Mild problems following Tdap: (Did not interfere with activities)  Pain where the shot was given (about  3 in 4 adolescents or 2 in 3 adults)  Redness or swelling where the shot was given (about 1 person in 5)  Mild fever of at least 100.4F (up to about 1 in 25 adolescents or 1 in 100 adults)  Headache (about 3 or 4 people in 10)  Tiredness (about 1 person in 3 or 4)  Nausea, vomiting, diarrhea, stomach ache (up to 1 in 4 adolescents or 1 in 10 adults)  Chills, sore joints (about 1 person in 10)  Body aches (about 1 person in 3 or 4)  Rash, swollen glands (uncommon)  Moderate problems following Tdap: (Interfered with activities, but did not require medical attention)  Pain where the shot was given (up to 1 in 5 or 6)  Redness or swelling where the shot was given (up to about 1 in 16 adolescents or 1 in 12 adults)  Fever over 102F (about 1 in 100 adolescents or 1 in 250 adults)  Headache (about 1 in 7 adolescents or 1 in 10 adults)  Nausea, vomiting, diarrhea, stomach ache (up to 1 or 3 people in 100)  Swelling of the entire arm where the shot was given (up to about 1 in 500).  Severe problems following Tdap: (Unable to perform usual activities; required medical attention)  Swelling, severe pain, bleeding and redness in the arm where the shot was given (rare).  Problems that could happen after any vaccine:  People sometimes faint after a medical procedure, including vaccination. Sitting or lying down for about 15 minutes can help prevent fainting, and injuries caused by a fall. Tell your doctor if you feel dizzy, or have vision changes or ringing in the ears.  Some people get severe pain in the shoulder and have difficulty moving the arm where a shot was given. This happens very rarely.  Any medication can cause a severe allergic reaction. Such reactions from a vaccine are very rare, estimated at fewer than 1 in a million doses, and would happen within a few minutes to a few hours after the vaccination. As with any medicine, there is a very remote chance of a vaccine  causing a serious injury or death. The safety of vaccines is always being monitored. For more information, visit: www.cdc.gov/vaccinesafety/ 5. What if there is a serious problem? What should I look for? Look for anything that concerns you, such as signs of a severe allergic reaction, very high fever, or unusual behavior. Signs of a severe allergic reaction can include hives, swelling of the face and throat, difficulty breathing, a fast heartbeat, dizziness, and weakness. These would usually start a few minutes to a few hours after the vaccination. What should I do?  If you think it is a severe allergic reaction or other emergency that can't wait, call 9-1-1 or get the person to the nearest hospital. Otherwise, call your doctor.  Afterward, the reaction should be reported to the Vaccine Adverse Event Reporting System (VAERS). Your doctor might file this report, or you can do it yourself through the VAERS web site at www.vaers.hhs.gov, or by calling 1-800-822-7967. ? VAERS does not give medical advice. 6. The National Vaccine Injury Compensation Program The National   Vaccine Injury Compensation Program (VICP) is a federal program that was created to compensate people who may have been injured by certain vaccines. Persons who believe they may have been injured by a vaccine can learn about the program and about filing a claim by calling 1-401-497-6999 or visiting the VICP website at SpiritualWord.at. There is a time limit to file a claim for compensation. 7. How can I learn more?  Ask your doctor. He or she can give you the vaccine package insert or suggest other sources of information.  Call your local or state health department.  Contact the Centers for Disease Control and Prevention (CDC): ? Call (838) 300-7975 (1-800-CDC-INFO) or ? Visit CDC's website at PicCapture.uy CDC Tdap Vaccine VIS (05/12/13) This information is not intended to replace advice given to you by your  health care provider. Make sure you discuss any questions you have with your health care provider. Document Released: 09/04/2011 Document Revised: 11/24/2015 Document Reviewed: 11/24/2015 Elsevier Interactive Patient Education  2017 [employer]    Sciatica Sciatica is pain, numbness, weakness, or tingling along the path of the sciatic nerve. The sciatic nerve starts in the lower back and runs down the back of each leg. The nerve controls the muscles in the lower leg and in the back of the knee. It also provides feeling (sensation) to the back of the thigh, the lower leg, and the sole of the foot. Sciatica is a symptom of another medical condition that pinches or puts pressure on the sciatic nerve. Generally, sciatica only affects one side of the body. Sciatica usually goes away on its own or with treatment. In some cases, sciatica may keep coming back (recur). What are the causes? This condition is caused by pressure on the sciatic nerve, or pinching of the sciatic nerve. This may be the result of:  A disk in between the bones of the spine (vertebrae) bulging out too far (herniated disk).  Age-related changes in the spinal disks (degenerative disk disease).  A pain disorder that affects a muscle in the buttock (piriformis syndrome).  Extra bone growth (bone spur) near the sciatic nerve.  An injury or break (fracture) of the pelvis.  Pregnancy.  Tumor (rare).  What increases the risk? The following factors may make you more likely to develop this condition:  Playing sports that place pressure or stress on the spine, such as football or weight lifting.  Having poor strength and flexibility.  A history of back injury.  A history of back surgery.  Sitting for long periods of time.  Doing activities that involve repetitive bending or lifting.  Obesity.  What are the signs or symptoms? Symptoms can vary from mild to very severe, and they may include:  Any of these problems  in the lower back, leg, hip, or buttock: ? Mild tingling or dull aches. ? Burning sensations. ? Sharp pains.  Numbness in the back of the calf or the sole of the foot.  Leg weakness.  Severe back pain that makes movement difficult.  These symptoms may get worse when you cough, sneeze, or laugh, or when you sit or stand for long periods of time. Being overweight may also make symptoms worse. In some cases, symptoms may recur over time. How is this diagnosed? This condition may be diagnosed based on:  Your symptoms.  A physical exam. Your health care provider may ask you to do certain movements to check whether those movements trigger your symptoms.  You may have tests, including: ? Blood  tests. ? X-rays. ? MRI. ? CT scan.  How is this treated? In many cases, this condition improves on its own, without any treatment. However, treatment may include:  Reducing or modifying physical activity during periods of pain.  Exercising and stretching to strengthen your abdomen and improve the flexibility of your spine.  Icing and applying heat to the affected area.  Medicines that help: ? To relieve pain and swelling. ? To relax your muscles.  Injections of medicines that help to relieve pain, irritation, and inflammation around the sciatic nerve (steroids).  Surgery.  Follow these instructions at home: Medicines  Take over-the-counter and prescription medicines only as told by your health care provider.  Do not drive or operate heavy machinery while taking prescription pain medicine. Managing pain  If directed, apply ice to the affected area. ? Put ice in a plastic bag. ? Place a towel between your skin and the bag. ? Leave the ice on for 20 minutes, 2-3 times a day.  After icing, apply heat to the affected area before you exercise or as often as told by your health care provider. Use the heat source that your health care provider recommends, such as a moist heat pack or a  heating pad. ? Place a towel between your skin and the heat source. ? Leave the heat on for 20-30 minutes. ? Remove the heat if your skin turns bright red. This is especially important if you are unable to feel pain, heat, or cold. You may have a greater risk of getting burned. Activity  Return to your normal activities as told by your health care provider. Ask your health care provider what activities are safe for you. ? Avoid activities that make your symptoms worse.  Take brief periods of rest throughout the day. Resting in a lying or standing position is usually better than sitting to rest. ? When you rest for longer periods, mix in some mild activity or stretching between periods of rest. This will help to prevent stiffness and pain. ? Avoid sitting for long periods of time without moving. Get up and move around at least one time each hour.  Exercise and stretch regularly, as told by your health care provider.  Do not lift anything that is heavier than 10 lb (4.5 kg) while you have symptoms of sciatica. When you do not have symptoms, you should still avoid heavy lifting, especially repetitive heavy lifting.  When you lift objects, always use proper lifting technique, which includes: ? Bending your knees. ? Keeping the load close to your body. ? Avoiding twisting. General instructions  Use good posture. ? Avoid leaning forward while sitting. ? Avoid hunching over while standing.  Maintain a healthy weight. Excess weight puts extra stress on your back and makes it difficult to maintain good posture.  Wear supportive, comfortable shoes. Avoid wearing high heels.  Avoid sleeping on a mattress that is too soft or too hard. A mattress that is firm enough to support your back when you sleep may help to reduce your pain.  Keep all follow-up visits as told by your health care provider. This is important. Contact a health care provider if:  You have pain that wakes you up when you are  sleeping.  You have pain that gets worse when you lie down.  Your pain is worse than you have experienced in the past.  Your pain lasts longer than 4 weeks.  You experience unexplained weight loss. Get help right away if:  You lose control of your bowel or bladder (incontinence).  You have: ? Weakness in your lower back, pelvis, buttocks, or legs that gets worse. ? Redness or swelling of your back. ? A burning sensation when you urinate. This information is not intended to replace advice given to you by your health care provider. Make sure you discuss any questions you have with your health care provider. Document Released: 02/27/2001 Document Revised: 08/09/2015 Document Reviewed: 11/12/2014 Elsevier Interactive Patient Education  2017 [employer].

## 2016-11-26 NOTE — Progress Notes (Signed)
   PRENATAL VISIT NOTE  Subjective:  Katherine Hooper is a 26 y.o. G3P2002 at 6242w4d being seen today for ongoing prenatal care.  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  Patient reports no complaints.  Contractions: Not present.  .  Movement: Present. Denies leaking of fluid.   The following portions of the patient's history were reviewed and updated as appropriate: allergies, current medications, past family history, past medical history, past social history, past surgical history and problem list. Problem list updated.  Objective:   Vitals:   11/26/16 0952  BP: 111/73  Pulse: 71  Weight: 192 lb 4.8 oz (87.2 kg)    Fetal Status: Fetal Heart Rate (bpm): 138 Fundal Height: 26 cm Movement: Present     General:  Alert, oriented and cooperative. Patient is in no acute distress.  Skin: Skin is warm and dry. No rash noted.   Cardiovascular: Normal heart rate noted  Respiratory: Normal respiratory effort, no problems with respiration noted  Abdomen: Soft, gravid, appropriate for gestational age.  Pain/Pressure: Present     Pelvic: Cervical exam deferred        Extremities: Normal range of motion.  Edema: None  Mental Status:  Normal mood and affect. Normal behavior. Normal judgment and thought content.   Assessment and Plan:  Pregnancy: G3P2002 at 5042w4d  1. Supervision of other normal pregnancy, antepartum   Preterm labor symptoms and general obstetric precautions including but not limited to vaginal bleeding, contractions, leaking of fluid and fetal movement were reviewed in detail with the patient. Please refer to After Visit Summary for other counseling recommendations.  Return in about 3 weeks  (around 12/17/2016) for ROB/GTT.   Dorathy KinsmanVirginia Bellami Farrelly, CNM

## 2016-12-05 ENCOUNTER — Telehealth: Payer: Self-pay

## 2016-12-05 NOTE — Telephone Encounter (Addendum)
Pt called and stated that she is having a lot of vaginal discomfort and irritation for the past couple of days can someone call her back.   Pt concern taking care of in visit.

## 2016-12-07 ENCOUNTER — Ambulatory Visit (INDEPENDENT_AMBULATORY_CARE_PROVIDER_SITE_OTHER): Payer: Medicaid Other | Admitting: *Deleted

## 2016-12-07 ENCOUNTER — Other Ambulatory Visit (HOSPITAL_COMMUNITY)
Admission: RE | Admit: 2016-12-07 | Discharge: 2016-12-07 | Disposition: A | Discharge status: Home / Self Care | Payer: Medicaid Other | Source: Ambulatory Visit | Attending: Family Medicine | Admitting: Family Medicine

## 2016-12-07 DIAGNOSIS — N898 Other specified noninflammatory disorders of vagina: Secondary | ICD-10-CM | POA: Diagnosis present

## 2016-12-07 DIAGNOSIS — Z113 Encounter for screening for infections with a predominantly sexual mode of transmission: Secondary | ICD-10-CM | POA: Diagnosis not present

## 2016-12-07 MED ORDER — TERCONAZOLE 0.4 % VA CREA: 1.0000 | TOPICAL_CREAM | Freq: Every day | VAGINAL | 0 refills | Status: AC

## 2016-12-07 NOTE — Progress Notes (Signed)
Pt in for self swab. She is complaining of vaginal discharge and itching. Swab obtained. Pt given rx for terazol for presumptive yeast infection. Advised that if anything else is found on swab we would call with a new rx. Patient is agreeable to this. No further questions or concerns.

## 2016-12-10 LAB — CERVICOVAGINAL ANCILLARY ONLY
Bacterial vaginitis: NEGATIVE
CHLAMYDIA, DNA PROBE: NEGATIVE
Candida vaginitis: POSITIVE — AB
Neisseria Gonorrhea: NEGATIVE
Trichomonas: NEGATIVE

## 2016-12-17 ENCOUNTER — Ambulatory Visit (INDEPENDENT_AMBULATORY_CARE_PROVIDER_SITE_OTHER): Payer: Medicaid Other | Admitting: Advanced Practice Midwife

## 2016-12-17 VITALS — BP 107/70 | HR 92 | Wt 191.8 lb

## 2016-12-17 DIAGNOSIS — Z3483 Encounter for supervision of other normal pregnancy, third trimester: Secondary | ICD-10-CM | POA: Diagnosis present

## 2016-12-17 DIAGNOSIS — Z23 Encounter for immunization: Secondary | ICD-10-CM | POA: Diagnosis not present

## 2016-12-17 DIAGNOSIS — Z3480 Encounter for supervision of other normal pregnancy, unspecified trimester: Secondary | ICD-10-CM

## 2016-12-17 NOTE — Patient Instructions (Addendum)
Labor Ladies: ? [phone] [email or web address] For waterbirth at [facility] hospital you also have to take the waterbirth class.   Third Trimester of Pregnancy The third trimester is from week 28 through week 40 (months 7 through 9). The third trimester is a time when the unborn baby (fetus) is growing rapidly. At the end of the ninth month, the fetus is about 20 inches in length and weighs 6-10 pounds. Body changes during your third trimester Your body will continue to go through many changes during pregnancy. The changes vary from woman to woman. During the third trimester:  Your weight will continue to increase. You can expect to gain 25-35 pounds (11-16 kg) by the end of the pregnancy.  You may begin to get stretch marks on your hips, abdomen, and breasts.  You may urinate more often because the fetus is moving lower into your pelvis and pressing on your bladder.  You may develop or continue to have heartburn. This is caused by increased hormones that slow down muscles in the digestive tract.  You may develop or continue to have constipation because increased hormones slow digestion and cause the muscles that push waste through your intestines to relax.  You may develop hemorrhoids. These are swollen veins (varicose veins) in the rectum that can itch or be painful.  You may develop swollen, bulging veins (varicose veins) in your legs.  You may have increased body aches in the pelvis, back, or thighs. This is due to weight gain and increased hormones that are relaxing your joints.  You may have changes in your hair. These can include thickening of your hair, rapid growth, and changes in texture. Some women also have hair loss during or after pregnancy, or hair that feels dry or thin. Your hair will most likely return to normal after your baby is born.  Your breasts will continue to grow and they will continue to become tender. A yellow fluid (colostrum) may leak from your  breasts. This is the first milk you are producing for your baby.  Your belly button may stick out.  You may notice more swelling in your hands, face, or ankles.  You may have increased tingling or numbness in your hands, arms, and legs. The skin on your belly may also feel numb.  You may feel short of breath because of your expanding uterus.  You may have more problems sleeping. This can be caused by the size of your belly, increased need to urinate, and an increase in your body's metabolism.  You may notice the fetus "dropping," or moving lower in your abdomen (lightening).  You may have increased vaginal discharge.  You may notice your joints feel loose and you may have pain around your pelvic bone.  What to expect at prenatal visits You will have prenatal exams every 2 weeks until week 36. Then you will have weekly prenatal exams. During a routine prenatal visit:  You will be weighed to make sure you and the baby are growing normally.  Your blood pressure will be taken.  Your abdomen will be measured to track your baby's growth.  The fetal heartbeat will be listened to.  Any test results from the previous visit will be discussed.  You may have a cervical check near your due date to see if your cervix has softened or thinned (effaced).  You will be tested for Group B streptococcus. This happens between 35 and 37 weeks.  Your health care provider may ask you:  What your birth  plan is.  How you are feeling.  If you are feeling the baby move.  If you have had any abnormal symptoms, such as leaking fluid, bleeding, severe headaches, or abdominal cramping.  If you are using any tobacco products, including cigarettes, chewing tobacco, and electronic cigarettes.  If you have any questions.  Other tests or screenings that may be performed during your third trimester include:  Blood tests that check for low iron levels (anemia).  Fetal testing to check the health,  activity level, and growth of the fetus. Testing is done if you have certain medical conditions or if there are problems during the pregnancy.  Nonstress test (NST). This test checks the health of your baby to make sure there are no signs of problems, such as the baby not getting enough oxygen. During this test, a belt is placed around your belly. The baby is made to move, and its heart rate is monitored during movement.  What is false labor? False labor is a condition in which you feel small, irregular tightenings of the muscles in the womb (contractions) that usually go away with rest, changing position, or drinking water. These are called Braxton Hicks contractions. Contractions may last for hours, days, or even weeks before true labor sets in. If contractions come at regular intervals, become more frequent, increase in intensity, or become painful, you should see your health care provider. What are the signs of labor?  Abdominal cramps.  Regular contractions that start at 10 minutes apart and become stronger and more frequent with time.  Contractions that start on the top of the uterus and spread down to the lower abdomen and back.  Increased pelvic pressure and dull back pain.  A watery or bloody mucus discharge that comes from the vagina.  Leaking of amniotic fluid. This is also known as your "water breaking." It could be a slow trickle or a gush. Let your health care provider know if it has a color or strange odor. If you have any of these signs, call your health care provider right away, even if it is before your due date. Follow these instructions at home: Medicines  Follow your health care provider's instructions regarding medicine use. Specific medicines may be either safe or unsafe to take during pregnancy.  Take a prenatal vitamin that contains at least 600 micrograms (mcg) of folic acid.  If you develop constipation, try taking a stool softener if your health care provider  approves. Eating and drinking  Eat a balanced diet that includes fresh fruits and vegetables, whole grains, good sources of protein such as meat, eggs, or tofu, and low-fat dairy. Your health care provider will help you determine the amount of weight gain that is right for you.  Avoid raw meat and uncooked cheese. These carry germs that can cause birth defects in the baby.  If you have low calcium intake from food, talk to your health care provider about whether you should take a daily calcium supplement.  Eat four or five small meals rather than three large meals a day.  Limit foods that are high in fat and processed sugars, such as fried and sweet foods.  To prevent constipation: ? Drink enough fluid to keep your urine clear or pale yellow. ? Eat foods that are high in fiber, such as fresh fruits and vegetables, whole grains, and beans. Activity  Exercise only as directed by your health care provider. Most women can continue their usual exercise routine during pregnancy. Try to  exercise for 30 minutes at least 5 days a week. Stop exercising if you experience uterine contractions.  Avoid heavy lifting.  Do not exercise in extreme heat or humidity, or at high altitudes.  Wear low-heel, comfortable shoes.  Practice good posture.  You may continue to have sex unless your health care provider tells you otherwise. Relieving pain and discomfort  Take frequent breaks and rest with your legs elevated if you have leg cramps or low back pain.  Take warm sitz baths to soothe any pain or discomfort caused by hemorrhoids. Use hemorrhoid cream if your health care provider approves.  Wear a good support bra to prevent discomfort from breast tenderness.  If you develop varicose veins: ? Wear support pantyhose or compression stockings as told by your healthcare provider. ? Elevate your feet for 15 minutes, 3-4 times a day. Prenatal care  Write down your questions. Take them to your prenatal  visits.  Keep all your prenatal visits as told by your health care provider. This is important. Safety  Wear your seat belt at all times when driving.  Make a list of emergency phone numbers, including numbers for family, friends, the hospital, and police and fire departments. General instructions  Avoid cat litter boxes and soil used by cats. These carry germs that can cause birth defects in the baby. If you have a cat, ask someone to clean the litter box for you.  Do not travel far distances unless it is absolutely necessary and only with the approval of your health care provider.  Do not use hot tubs, steam rooms, or saunas.  Do not drink alcohol.  Do not use any products that contain nicotine or tobacco, such as cigarettes and e-cigarettes. If you need help quitting, ask your health care provider.  Do not use any medicinal herbs or unprescribed drugs. These chemicals affect the formation and growth of the baby.  Do not douche or use tampons or scented sanitary pads.  Do not cross your legs for long periods of time.  To prepare for the arrival of your baby: ? Take prenatal classes to understand, practice, and ask questions about labor and delivery. ? Make a trial run to the hospital. ? Visit the hospital and tour the maternity area. ? Arrange for maternity or paternity leave through employers. ? Arrange for family and friends to take care of pets while you are in the hospital. ? Purchase a rear-facing car seat and make sure you know how to install it in your car. ? Pack your hospital bag. ? Prepare the baby's nursery. Make sure to remove all pillows and stuffed animals from the baby's crib to prevent suffocation.  Visit your dentist if you have not gone during your pregnancy. Use a soft toothbrush to brush your teeth and be gentle when you floss. Contact a health care provider if:  You are unsure if you are in labor or if your water has broken.  You become dizzy.  You  have mild pelvic cramps, pelvic pressure, or nagging pain in your abdominal area.  You have lower back pain.  You have persistent nausea, vomiting, or diarrhea.  You have an unusual or bad smelling vaginal discharge.  You have pain when you urinate. Get help right away if:  Your water breaks before 37 weeks.  You have regular contractions less than 5 minutes apart before 37 weeks.  You have a fever.  You are leaking fluid from your vagina.  You have spotting or bleeding  from your vagina.  You have severe abdominal pain or cramping.  You have rapid weight loss or weight gain.  You have shortness of breath with chest pain.  You notice sudden or extreme swelling of your face, hands, ankles, feet, or legs.  Your baby makes fewer than 10 movements in 2 hours.  You have severe headaches that do not go away when you take medicine.  You have vision changes. Summary  The third trimester is from week 28 through week 40, months 7 through 9. The third trimester is a time when the unborn baby (fetus) is growing rapidly.  During the third trimester, your discomfort may increase as you and your baby continue to gain weight. You may have abdominal, leg, and back pain, sleeping problems, and an increased need to urinate.  During the third trimester your breasts will keep growing and they will continue to become tender. A yellow fluid (colostrum) may leak from your breasts. This is the first milk you are producing for your baby.  False labor is a condition in which you feel small, irregular tightenings of the muscles in the womb (contractions) that eventually go away. These are called Braxton Hicks contractions. Contractions may last for hours, days, or even weeks before true labor sets in.  Signs of labor can include: abdominal cramps; regular contractions that start at 10 minutes apart and become stronger and more frequent with time; watery or bloody mucus discharge that comes from the  vagina; increased pelvic pressure and dull back pain; and leaking of amniotic fluid. This information is not intended to replace advice given to you by your health care provider. Make sure you discuss any questions you have with your health care provider. Document Released: 02/27/2001 Document Revised: 08/11/2015 Document Reviewed: 05/06/2012 Elsevier Interactive Patient Education  2017 [employer].

## 2016-12-17 NOTE — Progress Notes (Signed)
Entered in error

## 2016-12-17 NOTE — Progress Notes (Signed)
   PRENATAL VISIT NOTE  Subjective:  Katherine Hooper is a 26 y.o. G3P2002 at [redacted]w[redacted]d being seen today for ongoing prenatal care.  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  Patient reports no complaints.  Contractions: Irregular. Vag. Bleeding: None.  Movement: Present. Denies leaking of fluid.   The following portions of the patient's history were reviewed and updated as appropriate: allergies, current medications, past family history, past medical history, past social history, past surgical history and problem list. Problem list updated.  Objective:   Vitals:   12/17/16 0837  BP: 107/70  Pulse: 92  Weight: 191 lb 12.8 oz (87 kg)    Fetal Status: Fetal Heart Rate (bpm): 148   Movement: Present     General:  Alert, oriented and cooperative. Patient is in no acute distress.  Skin: Skin is warm and dry. No rash noted.   Cardiovascular: Normal heart rate noted  Respiratory: Normal respiratory effort, no problems with respiration noted  Abdomen: Soft, gravid, appropriate for gestational age.  Pain/Pressure: Present     Pelvic: Cervical exam deferred        Extremities: Normal range of motion.  Edema: None  Mental Status:  Normal mood and affect. Normal behavior. Normal judgment and thought content.   Assessment and Plan:  Pregnancy: G3P2002 at [redacted]w[redacted]d  1. Supervision of other normal pregnancy, antepartum  - Glucose Tolerance, 2 Hours w/1 Hour - RPR - CBC - HIV antibody (with reflex) - Flu Vaccine QUAD 36+ mos IM - Tdap vaccine greater than or equal to 7yo IM - Info given on water birth   Preterm labor symptoms and general obstetric precautions including but not limited to vaginal bleeding,  contractions, leaking of fluid and fetal movement were reviewed in detail with the patient. Please refer to After Visit Summary for other counseling recommendations.  Return in about 2 weeks (around 12/31/2016).   Thressa Sheller, CNM

## 2016-12-17 NOTE — Progress Notes (Signed)
107/70 

## 2016-12-18 LAB — CBC
HEMATOCRIT: 38.6 % (ref 34.0–46.6)
HEMOGLOBIN: 12.6 g/dL (ref 11.1–15.9)
MCH: 29.9 pg (ref 26.6–33.0)
MCHC: 32.6 g/dL (ref 31.5–35.7)
MCV: 92 fL (ref 79–97)
Platelets: 163 10*3/uL (ref 150–379)
RBC: 4.21 x10E6/uL (ref 3.77–5.28)
RDW: 13.2 % (ref 12.3–15.4)
WBC: 6 10*3/uL (ref 3.4–10.8)

## 2016-12-18 LAB — RPR: RPR: NONREACTIVE

## 2016-12-18 LAB — GLUCOSE TOLERANCE, 2 HOURS W/ 1HR
Glucose, 1 hour: 136 mg/dL (ref 65–179)
Glucose, 2 hour: 103 mg/dL (ref 65–152)
Glucose, Fasting: 71 mg/dL (ref 65–91)

## 2016-12-18 LAB — HIV ANTIBODY (ROUTINE TESTING W REFLEX): HIV Screen 4th Generation wRfx: NONREACTIVE

## 2016-12-31 ENCOUNTER — Ambulatory Visit (INDEPENDENT_AMBULATORY_CARE_PROVIDER_SITE_OTHER): Payer: Medicaid Other | Admitting: Obstetrics & Gynecology

## 2016-12-31 ENCOUNTER — Encounter: Payer: Medicaid Other | Admitting: Advanced Practice Midwife

## 2016-12-31 VITALS — BP 124/71 | HR 75 | Wt 192.5 lb

## 2016-12-31 DIAGNOSIS — Z3480 Encounter for supervision of other normal pregnancy, unspecified trimester: Secondary | ICD-10-CM

## 2016-12-31 NOTE — Progress Notes (Signed)
Educated pt on Rooming In  

## 2016-12-31 NOTE — Progress Notes (Signed)
   PRENATAL VISIT NOTE  Subjective:  Katherine Hooper is a 26 y.o. S AA G3P2002 at [redacted]w[redacted]d being seen today for ongoing prenatal care.  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  Patient reports no complaints.  Contractions: Irritability. Vag. Bleeding: None.  Movement: Present. Denies leaking of fluid.   The following portions of the patient's history were reviewed and updated as appropriate: allergies, current medications, past family history, past medical history, past social history, past surgical history and problem list. Problem list updated.  Objective:   Vitals:   12/31/16 1010  BP: 124/71  Pulse: 75  Weight: 192 lb 8 oz (87.3 kg)    Fetal Status: Fetal Heart Rate (bpm): 135   Movement: Present     General:  Alert, oriented and cooperative. Patient is in no acute distress.  Skin: Skin is warm and dry. No rash noted.   Cardiovascular: Normal heart rate noted  Respiratory: Normal respiratory effort, no problems with respiration noted  Abdomen: Soft, gravid, appropriate for gestational age.  Pain/Pressure: Present     Pelvic: Cervical exam deferred        Extremities: Normal range of motion.  Edema: None  Mental Status:  Normal mood and affect. Normal behavior. Normal judgment and thought content.   Assessment and Plan:  Pregnancy: G3P2002 at [redacted]w[redacted]d We discussed contraception choices, handout for [place] given  There are no diagnoses linked to this encounter. Preterm labor symptoms and general obstetric precautions including but not limited to vaginal bleeding, contractions, leaking of fluid and fetal movement were reviewed in detail with the patient. Please refer to After Visit Summary for  other counseling recommendations.  No Follow-up on file.   Allie Bossier, MD

## 2017-01-14 ENCOUNTER — Other Ambulatory Visit (HOSPITAL_COMMUNITY)
Admission: RE | Admit: 2017-01-14 | Discharge: 2017-01-14 | Disposition: A | Discharge status: Home / Self Care | Payer: Medicaid Other | Source: Ambulatory Visit | Attending: Advanced Practice Midwife | Admitting: Advanced Practice Midwife

## 2017-01-14 ENCOUNTER — Ambulatory Visit (INDEPENDENT_AMBULATORY_CARE_PROVIDER_SITE_OTHER): Payer: Medicaid Other | Admitting: Advanced Practice Midwife

## 2017-01-14 VITALS — BP 111/76 | HR 85 | Wt 199.9 lb

## 2017-01-14 DIAGNOSIS — Z113 Encounter for screening for infections with a predominantly sexual mode of transmission: Secondary | ICD-10-CM | POA: Diagnosis not present

## 2017-01-14 DIAGNOSIS — B373 Candidiasis of vulva and vagina: Secondary | ICD-10-CM | POA: Insufficient documentation

## 2017-01-14 DIAGNOSIS — O4693 Antepartum hemorrhage, unspecified, third trimester: Secondary | ICD-10-CM

## 2017-01-14 DIAGNOSIS — O4690 Antepartum hemorrhage, unspecified, unspecified trimester: Secondary | ICD-10-CM | POA: Diagnosis not present

## 2017-01-14 DIAGNOSIS — Z3483 Encounter for supervision of other normal pregnancy, third trimester: Secondary | ICD-10-CM

## 2017-01-14 DIAGNOSIS — O98819 Other maternal infectious and parasitic diseases complicating pregnancy, unspecified trimester: Secondary | ICD-10-CM | POA: Insufficient documentation

## 2017-01-14 DIAGNOSIS — Z3A00 Weeks of gestation of pregnancy not specified: Secondary | ICD-10-CM | POA: Insufficient documentation

## 2017-01-14 DIAGNOSIS — Z3480 Encounter for supervision of other normal pregnancy, unspecified trimester: Secondary | ICD-10-CM

## 2017-01-14 DIAGNOSIS — B3731 Acute candidiasis of vulva and vagina: Secondary | ICD-10-CM

## 2017-01-14 MED ORDER — TERCONAZOLE 0.4 % VA CREA: 1.0000 | TOPICAL_CREAM | Freq: Every day | VAGINAL | 0 refills | Status: DC

## 2017-01-14 NOTE — Patient Instructions (Signed)
[facility] [place] [address], [address] [place], [place]  [address] Phone - [phone]   Fax - [phone]  [facility] [place] [place] [address] [place], [place] [address] Phone - [phone]   Fax - [place] [address] [place], [place]  [address] Phone - [phone]   Fax - [phone]  [place]. [address] [place] [address] [place], [place]  [address] Phone - [phone]   Fax - [phone]  [place] [address] [place], [place]  [address] Phone - [phone]   Fax - [phone]  [facility] [address] [place], [place]  [address] Phone - [phone]   Fax - [place] [address] [place], [place]  [address] Phone - [phone]   Fax - [phone]  [place] [address] [place], [place] [address] [place], [place]  [address] Phone - [phone]   Fax - [place] [address] [place], [place]  [address] Phone - [phone]   Fax - [phone] [facility] [place]. [address] [place], [place]  [address] Phone - [phone]   Fax - [phone]  [place] [address] [place], [place]  [address] Phone - [phone]   Fax - [phone]  [facility] [place], [place], [place]  [address] Phone - [phone]   Fax - [place] [address] [place], [place]  [address] Phone - [phone]   Fax - [phone]  [facility] [address] [place], [place]  [address] Phone - [place], [place]  [address] Phone - [phone]   Fax - [place] [address] [place], [place]  [address] Phone - [phone]   Fax - [phone]  [place] [address] [place], [place]  [address] Phone - [phone]   Fax - [place]. [place], [place]  [address] Phone - [phone]   Fax - [place], [place], [place]  [address] Phone - [phone]   Fax - [place] [address], [place], [place]  [address] Phone - [phone]   Fax - [phone]  [place] [address] [place], [place]  [address] Phone - [phone]   Fax - [place] [address] [place], [place]  [address] Phone - [phone]   Fax - [phone]  [place] [address] [place], [place]  [address] Phone - [phone]   Fax - [phone] [place] [address] [place], [place]  [address] Phone - [phone]   Fax - [place] [address] [place], [place]  [address] Phone - [phone]   Fax - [place] [address] [place], [place]  [address] Phone - [phone]   Fax - [phone]  [place] [address] [place] [address] Phone [phone]  Fax [phone]  Places to have your son circumcised:    [facility] [phone] while you  are in hospital  [facility] [phone] $244 by 4 wks  [facility] [phone] $175 by 2 wks  [facility] [phone] $250 by 7 days [facility] [phone] $[phone] by 4 wks  These prices sometimes change but are roughly what you can expect to pay. Please  call and confirm pricing.   Circumcision is considered an elective/non-medically necessary procedure. There are many reasons parents decide to have their sons circumsized. During the first year of life circumcised males have a reduced risk of urinary tract infections but after this year the rates between circumcised males and uncircumcised males are the same.  It is safe to have your son circumcised outside of the hospital and the places above perform them regularly.     

## 2017-01-14 NOTE — Progress Notes (Signed)
Patient ID: Katherine Hooper, female   DOB: 02/12/1991, 26 y.o.   MRN: [id number]    PRENATAL VISIT NOTE  Subjective:  Katherine MuleKaneisha Choma is a 26 y.o. G3P2002 at 5169w4d being seen today for ongoing prenatal care.  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  Patient reports backache, vaginal irritation and spotting when wiping .  Contractions: Irregular. Vag. Bleeding: None.  Movement: Present. Denies leaking of fluid.  Patient with vulvar irritation and some spotting when wiping x 2-3 days. Denies LOF or contractions. Reports normal fetal movement.   The following portions of the patient's history were reviewed and updated as appropriate: allergies, current medications, past family history, past medical history, past social history, past surgical history and problem list. Problem list updated.  Objective:   Vitals:   01/14/17 0833  BP: 111/76  Pulse: 85  Weight: 199 lb 14.4 oz (90.7 kg)    Fetal Status: Fetal Heart Rate (bpm): 142   Movement: Present     FH: 32 cm  General:  Alert, oriented and cooperative. Patient is in no acute distress.  Skin: Skin is warm and dry. No rash noted.   Cardiovascular: Normal heart rate noted  Respiratory: Normal respiratory effort, no problems with respiration noted  Abdomen: Soft, gravid, appropriate for gestational age.  Pain/Pressure: Present     Pelvic: Cervical exam performed      Large amount of thick, white vaginal discharge. No blood seen.   Extremities: Normal range of motion.  Edema: Trace  Mental Status:  Normal mood and affect. Normal behavior. Normal judgment and thought content.   Assessment and Plan:  Pregnancy: G3P2002 at 6369w4d  1. Supervision of other normal pregnancy, antepartum - Routine  care  2. Vaginal bleeding during pregnancy  - Cervicovaginal ancillary only  3. Yeast infection involving the vagina and surrounding area - Terazol 7 as directed sent to pharmacy   Preterm labor symptoms and general obstetric precautions including but not limited to vaginal bleeding, contractions, leaking of fluid and fetal movement were reviewed in detail with the patient. Please refer to After Visit Summary for other counseling recommendations.  Return in about 2 weeks (around 01/28/2017).   Thressa ShellerHeather Morganna Styles, CNM

## 2017-01-14 NOTE — Progress Notes (Signed)
Pt stated having pain lower abdominal especially standing for long term.

## 2017-01-15 LAB — CERVICOVAGINAL ANCILLARY ONLY
Bacterial vaginitis: NEGATIVE
CANDIDA VAGINITIS: POSITIVE — AB
CHLAMYDIA, DNA PROBE: NEGATIVE
NEISSERIA GONORRHEA: NEGATIVE
TRICH (WINDOWPATH): NEGATIVE

## 2017-01-19 ENCOUNTER — Other Ambulatory Visit: Payer: Self-pay | Admitting: Advanced Practice Midwife

## 2017-01-28 ENCOUNTER — Encounter: Payer: Medicaid Other | Admitting: Advanced Practice Midwife

## 2017-02-11 ENCOUNTER — Encounter: Payer: Self-pay | Admitting: Obstetrics and Gynecology

## 2017-02-11 ENCOUNTER — Encounter: Payer: Medicaid Other | Admitting: Advanced Practice Midwife

## 2017-02-11 ENCOUNTER — Ambulatory Visit (INDEPENDENT_AMBULATORY_CARE_PROVIDER_SITE_OTHER): Payer: Medicaid Other | Admitting: Obstetrics and Gynecology

## 2017-02-11 VITALS — BP 125/85 | HR 84 | Wt 202.0 lb

## 2017-02-11 DIAGNOSIS — Z3480 Encounter for supervision of other normal pregnancy, unspecified trimester: Secondary | ICD-10-CM

## 2017-02-11 MED ORDER — COMFORT FIT MATERNITY SUPP LG MISC: 1.0000 | 0 refills | Status: DC | PRN

## 2017-02-11 NOTE — Addendum Note (Signed)
Addended by: Mikey BussingWILSON, CHIQUITA L on: 02/11/2017 04:34 PM   Modules accepted: Orders

## 2017-02-11 NOTE — Patient Instructions (Signed)

## 2017-02-11 NOTE — Progress Notes (Signed)
   PRENATAL VISIT NOTE  Subjective:  Katherine Hooper is a 26 y.o. G3P2002 at 8920w4d being seen today for ongoing prenatal care.  She is currently monitored for the following issues for this low-risk pregnancy and has Allergy to penicillin; GERD (gastroesophageal reflux disease); Supervision of other normal pregnancy, antepartum; and Nausea and vomiting in pregnancy prior to [redacted] weeks gestation on their problem list.  Patient reports pelvic pressure and pain.  Contractions: Irregular. Vag. Bleeding: None.  Movement: Present. Denies leaking of fluid.   The following portions of the patient's history were reviewed and updated as appropriate: allergies, current medications, past family history, past medical history, past social history, past surgical history and problem list. Problem list updated.  Objective:   Vitals:   02/11/17 1605  BP: 125/85  Pulse: 84  Weight: 202 lb (91.6 kg)    Fetal Status: Fetal Heart Rate (bpm): 141   Movement: Present     General:  Alert, oriented and cooperative. Patient is in no acute distress.  Skin: Skin is warm and dry. No rash noted.   Cardiovascular: Normal heart rate noted  Respiratory: Normal respiratory effort, no problems with respiration noted  Abdomen: Soft, gravid, appropriate for gestational age.  Pain/Pressure: Present     Pelvic: Cervical exam deferred        Extremities: Normal range of motion.  Edema: Trace  Mental Status:  Normal mood and affect. Normal behavior. Normal judgment and thought content.   Assessment and Plan:  Pregnancy: G3P2002 at 5420w4d  1. Supervision of other normal pregnancy, antepartum No issues - Culture, beta strep (group b only)  2. Pelvic pressure, hip pain - maternity support belt given  Preterm labor symptoms and general obstetric precautions including but not limited to vaginal bleeding, contractions, leaking of fluid and fetal movement were reviewed in detail with the patient. Please refer to After Visit  Summary for other counseling recommendations.  Return in about 1 week (around 02/18/2017) for OB visit.   Conan BowensKelly M Romonda Parker, MD

## 2017-02-14 LAB — CULTURE, BETA STREP (GROUP B ONLY): Strep Gp B Culture: POSITIVE — AB

## 2017-02-18 ENCOUNTER — Ambulatory Visit (INDEPENDENT_AMBULATORY_CARE_PROVIDER_SITE_OTHER): Payer: Medicaid Other | Admitting: Advanced Practice Midwife

## 2017-02-18 VITALS — BP 119/89 | HR 101 | Wt 204.8 lb

## 2017-02-18 DIAGNOSIS — Z3483 Encounter for supervision of other normal pregnancy, third trimester: Secondary | ICD-10-CM

## 2017-02-18 DIAGNOSIS — Z3480 Encounter for supervision of other normal pregnancy, unspecified trimester: Secondary | ICD-10-CM

## 2017-02-18 NOTE — Progress Notes (Signed)
Feels like face swelling some and having a little in hands, feet sometimes. Denies headaches, visual disturbance.

## 2017-02-18 NOTE — Progress Notes (Signed)
   PRENATAL VISIT NOTE  Subjective:  Katherine Hooper is a 26 y.o. G3P2002 at 9029w4d being seen today for ongoing prenatal care.  She is currently monitored for the following issues for this low-risk pregnancy and has Allergy to penicillin; GERD (gastroesophageal reflux disease); Supervision of other normal pregnancy, antepartum; and Nausea and vomiting in pregnancy prior to [redacted] weeks gestation on their problem list.  Patient reports edema, and she is worried about pre-eclampsia. She denies any history of pre-eclampsia with prior pregnancies. .  Contractions: Irregular. Vag. Bleeding: None.  Movement: Present. Denies leaking of fluid.   The following portions of the patient's history were reviewed and updated as appropriate: allergies, current medications, past family history, past medical history, past social history, past surgical history and problem list. Problem list updated.  Objective:   Vitals:   02/18/17 1016  BP: 119/89  Pulse: (!) 101  Weight: 204 lb 12.8 oz (92.9 kg)    Fetal Status: Fetal Heart Rate (bpm): 146 Fundal Height: 37 cm Movement: Present     General:  Alert, oriented and cooperative. Patient is in no acute distress.  Skin: Skin is warm and dry. No rash noted.   Cardiovascular: Normal heart rate noted  Respiratory: Normal respiratory effort, no problems with respiration noted  Abdomen: Soft, gravid, appropriate for gestational age.  Pain/Pressure: Present     Pelvic: Cervical exam performed Dilation: 1 Effacement (%): 50 Station: -3  Extremities: Normal range of motion.  Edema: Trace  Mental Status:  Normal mood and affect. Normal behavior. Normal judgment and thought content.   Assessment and Plan:  Pregnancy: G3P2002 at 7329w4d  1. Supervision of other normal pregnancy, antepartum - GBS positive, sensitivities not done. Spoke with lab, too late to add. Will recollect today.  - pre-eclampsia warning signs reviewed   Term labor symptoms and general obstetric  precautions including but not limited to vaginal bleeding, contractions, leaking of fluid and fetal movement were reviewed in detail with the patient. Please refer to After Visit Summary for other counseling recommendations.  Return in about 1 week (around 02/25/2017).   Thressa ShellerHeather Hogan, CNM

## 2017-02-18 NOTE — Patient Instructions (Addendum)
Preeclampsia and Eclampsia °Preeclampsia is a serious condition that develops only during pregnancy. It is also called toxemia of pregnancy. This condition causes high blood pressure along with other symptoms, such as swelling and headaches. These symptoms may develop as the condition gets worse. Preeclampsia may occur at 20 weeks of pregnancy or later. °Diagnosing and treating preeclampsia early is very important. If not treated early, it can cause serious problems for you and your baby. One problem it can lead to is eclampsia, which is a condition that causes muscle jerking or shaking (convulsions or seizures) in the mother. Delivering your baby is the best treatment for preeclampsia or eclampsia. Preeclampsia and eclampsia symptoms usually go away after your baby is born. °What are the causes? °The cause of preeclampsia is not known. °What increases the risk? °The following risk factors make you more likely to develop preeclampsia: °· Being pregnant for the first time. °· Having had preeclampsia during a past pregnancy. °· Having a family history of preeclampsia. °· Having high blood pressure. °· Being pregnant with twins or triplets. °· Being 35 or older. °· Being African-American. °· Having kidney disease or diabetes. °· Having medical conditions such as lupus or blood diseases. °· Being very overweight (obese). ° °What are the signs or symptoms? °The earliest signs of preeclampsia are: °· High blood pressure. °· Increased protein in your urine. Your health care provider will check for this at every visit before you give birth (prenatal visit). ° °Other symptoms that may develop as the condition gets worse include: °· Severe headaches. °· Sudden weight gain. °· Swelling of the hands, face, legs, and feet. °· Nausea and vomiting. °· Vision problems, such as blurred or double vision. °· Numbness in the face, arms, legs, and feet. °· Urinating less than usual. °· Dizziness. °· Slurred speech. °· Abdominal pain,  especially upper abdominal pain. °· Convulsions or seizures. ° °Symptoms generally go away after giving birth. °How is this diagnosed? °There are no screening tests for preeclampsia. Your health care provider will ask you about symptoms and check for signs of preeclampsia during your prenatal visits. You may also have tests that include: °· Urine tests. °· Blood tests. °· Checking your blood pressure. °· Monitoring your baby’s heart rate. °· Ultrasound. ° °How is this treated? °You and your health care provider will determine the treatment approach that is best for you. Treatment may include: °· Having more frequent prenatal exams to check for signs of preeclampsia, if you have an increased risk for preeclampsia. °· Bed rest. °· Reducing how much salt (sodium) you eat. °· Medicine to lower your blood pressure. °· Staying in the hospital, if your condition is severe. There, treatment will focus on controlling your blood pressure and the amount of fluids in your body (fluid retention). °· You may need to take medicine (magnesium sulfate) to prevent seizures. This medicine may be given as an injection or through an IV tube. °· Delivering your baby early, if your condition gets worse. You may have your labor started with medicine (induced), or you may have a cesarean delivery. ° °Follow these instructions at home: °Eating and drinking ° °· Drink enough fluid to keep your urine clear or pale yellow. °· Eat a healthy diet that is low in sodium. Do not add salt to your food. Check nutrition labels to see how much sodium a food or beverage contains. °· Avoid caffeine. °Lifestyle °· Do not use any products that contain nicotine or tobacco, such as cigarettes   and e-cigarettes. If you need help quitting, ask your health care provider. °· Do not use alcohol or drugs. °· Avoid stress as much as possible. Rest and get plenty of sleep. °General instructions °· Take over-the-counter and prescription medicines only as told by your  health care provider. °· When lying down, lie on your side. This keeps pressure off of your baby. °· When sitting or lying down, raise (elevate) your feet. Try putting some pillows underneath your lower legs. °· Exercise regularly. Ask your health care provider what kinds of exercise are best for you. °· Keep all follow-up and prenatal visits as told by your health care provider. This is important. °How is this prevented? °To prevent preeclampsia or eclampsia from developing during another pregnancy: °· Get proper medical care during pregnancy. Your health care provider may be able to prevent preeclampsia or diagnose and treat it early. °· Your health care provider may have you take a low-dose aspirin or a calcium supplement during your next pregnancy. °· You may have tests of your blood pressure and kidney function after giving birth. °· Maintain a healthy weight. Ask your health care provider for help managing weight gain during pregnancy. °· Work with your health care provider to manage any long-term (chronic) health conditions you have, such as diabetes or kidney problems. ° °Contact a health care provider if: °· You gain more weight than expected. °· You have headaches. °· You have nausea or vomiting. °· You have abdominal pain. °· You feel dizzy or light-headed. °Get help right away if: °· You develop sudden or severe swelling anywhere in your body. This usually happens in the legs. °· You gain 5 lbs (2.3 kg) or more during one week. °· You have severe: °? Abdominal pain. °? Headaches. °? Dizziness. °? Vision problems. °? Confusion. °? Nausea or vomiting. °· You have a seizure. °· You have trouble moving any part of your body. °· You develop numbness in any part of your body. °· You have trouble speaking. °· You have any abnormal bleeding. °· You pass out. °This information is not intended to replace advice given to you by your health care provider. Make sure you discuss any questions you have with your health  care provider. °Document Released: 03/02/2000 Document Revised: 11/01/2015 Document Reviewed: 10/10/2015 °Elsevier Interactive Patient Education © 2018 [employer] ° °

## 2017-02-23 ENCOUNTER — Other Ambulatory Visit: Payer: Self-pay | Admitting: Advanced Practice Midwife

## 2017-02-23 DIAGNOSIS — Z3480 Encounter for supervision of other normal pregnancy, unspecified trimester: Secondary | ICD-10-CM

## 2017-02-23 LAB — STREP GP B CULTURE+RFLX: Strep Gp B Culture+Rflx: POSITIVE — AB

## 2017-02-23 LAB — STREP GP B SUSCEPTIBILITY

## 2017-02-25 ENCOUNTER — Encounter: Payer: Medicaid Other | Admitting: Advanced Practice Midwife

## 2017-02-26 ENCOUNTER — Inpatient Hospital Stay (HOSPITAL_COMMUNITY)
Admission: AD | Admit: 2017-02-26 | Discharge: 2017-02-28 | DRG: 807 | Disposition: A | Discharge status: Home / Self Care | Payer: Medicaid Other | Source: Ambulatory Visit | Attending: Obstetrics and Gynecology | Admitting: Obstetrics and Gynecology

## 2017-02-26 ENCOUNTER — Encounter (HOSPITAL_COMMUNITY): Payer: Self-pay

## 2017-02-26 DIAGNOSIS — Z87891 Personal history of nicotine dependence: Secondary | ICD-10-CM | POA: Diagnosis not present

## 2017-02-26 DIAGNOSIS — O99824 Streptococcus B carrier state complicating childbirth: Secondary | ICD-10-CM | POA: Diagnosis present

## 2017-02-26 DIAGNOSIS — O134 Gestational [pregnancy-induced] hypertension without significant proteinuria, complicating childbirth: Secondary | ICD-10-CM | POA: Diagnosis present

## 2017-02-26 DIAGNOSIS — Z3483 Encounter for supervision of other normal pregnancy, third trimester: Secondary | ICD-10-CM | POA: Diagnosis present

## 2017-02-26 DIAGNOSIS — R030 Elevated blood-pressure reading, without diagnosis of hypertension: Secondary | ICD-10-CM

## 2017-02-26 DIAGNOSIS — Z880 Allergy status to penicillin: Secondary | ICD-10-CM | POA: Diagnosis not present

## 2017-02-26 DIAGNOSIS — O34211 Maternal care for low transverse scar from previous cesarean delivery: Secondary | ICD-10-CM | POA: Diagnosis present

## 2017-02-26 DIAGNOSIS — Z3A38 38 weeks gestation of pregnancy: Secondary | ICD-10-CM | POA: Diagnosis not present

## 2017-02-26 LAB — CBC
HCT: 39.6 % (ref 36.0–46.0)
HEMOGLOBIN: 13 g/dL (ref 12.0–15.0)
MCH: 29.1 pg (ref 26.0–34.0)
MCHC: 32.8 g/dL (ref 30.0–36.0)
MCV: 88.6 fL (ref 78.0–100.0)
PLATELETS: 139 10*3/uL — AB (ref 150–400)
RBC: 4.47 MIL/uL (ref 3.87–5.11)
RDW: 13.3 % (ref 11.5–15.5)
WBC: 8.9 10*3/uL (ref 4.0–10.5)

## 2017-02-26 LAB — TYPE AND SCREEN
ABO/RH(D): B POS
Antibody Screen: NEGATIVE

## 2017-02-26 LAB — RPR: RPR: NONREACTIVE

## 2017-02-26 MED ORDER — FENTANYL CITRATE (PF) 100 MCG/2ML IJ SOLN
100.0000 ug | INTRAMUSCULAR | Status: DC | PRN
  Administered 2017-02-26: 100 ug via INTRAVENOUS
  Filled 2017-02-26: qty 2

## 2017-02-26 MED ORDER — ONDANSETRON HCL 4 MG PO TABS: 4.0000 mg | ORAL_TABLET | ORAL | Status: DC | PRN

## 2017-02-26 MED ORDER — ONDANSETRON HCL 4 MG/2ML IJ SOLN: 4.0000 mg | INTRAMUSCULAR | Status: DC | PRN

## 2017-02-26 MED ORDER — ONDANSETRON HCL 4 MG/2ML IJ SOLN: 4.0000 mg | Freq: Four times a day (QID) | INTRAMUSCULAR | Status: DC | PRN

## 2017-02-26 MED ORDER — OXYCODONE-ACETAMINOPHEN 5-325 MG PO TABS: 1.0000 | ORAL_TABLET | ORAL | Status: DC | PRN

## 2017-02-26 MED ORDER — SENNOSIDES-DOCUSATE SODIUM 8.6-50 MG PO TABS
2.0000 | ORAL_TABLET | ORAL | Status: DC
  Administered 2017-02-26 – 2017-02-28 (×2): 2 via ORAL
  Filled 2017-02-26 (×2): qty 2

## 2017-02-26 MED ORDER — ACETAMINOPHEN 325 MG PO TABS
650.0000 mg | ORAL_TABLET | ORAL | Status: DC | PRN
  Administered 2017-02-26: 650 mg via ORAL
  Filled 2017-02-26: qty 2

## 2017-02-26 MED ORDER — LACTATED RINGERS IV SOLN
INTRAVENOUS | Status: DC
  Administered 2017-02-26: 05:00:00 via INTRAVENOUS

## 2017-02-26 MED ORDER — LACTATED RINGERS IV SOLN: 500.0000 mL | INTRAVENOUS | Status: DC | PRN

## 2017-02-26 MED ORDER — SOD CITRATE-CITRIC ACID 500-334 MG/5ML PO SOLN: 30.0000 mL | ORAL | Status: DC | PRN

## 2017-02-26 MED ORDER — OXYTOCIN BOLUS FROM INFUSION
500.0000 mL | Freq: Once | INTRAVENOUS | Status: AC
  Administered 2017-02-26: 500 mL via INTRAVENOUS

## 2017-02-26 MED ORDER — FENTANYL 2.5 MCG/ML BUPIVACAINE 1/10 % EPIDURAL INFUSION (WH - ANES)
14.0000 mL/h | INTRAMUSCULAR | Status: DC | PRN
  Filled 2017-02-26: qty 100

## 2017-02-26 MED ORDER — SIMETHICONE 80 MG PO CHEW: 80.0000 mg | CHEWABLE_TABLET | ORAL | Status: DC | PRN

## 2017-02-26 MED ORDER — DIBUCAINE 1 % RE OINT: 1.0000 "application " | TOPICAL_OINTMENT | RECTAL | Status: DC | PRN

## 2017-02-26 MED ORDER — WITCH HAZEL-GLYCERIN EX PADS: 1.0000 "application " | MEDICATED_PAD | CUTANEOUS | Status: DC | PRN

## 2017-02-26 MED ORDER — IBUPROFEN 600 MG PO TABS
600.0000 mg | ORAL_TABLET | Freq: Four times a day (QID) | ORAL | Status: DC
  Administered 2017-02-26 – 2017-02-28 (×8): 600 mg via ORAL
  Filled 2017-02-26 (×9): qty 1

## 2017-02-26 MED ORDER — FLEET ENEMA 7-19 GM/118ML RE ENEM: 1.0000 | ENEMA | RECTAL | Status: DC | PRN

## 2017-02-26 MED ORDER — OXYCODONE-ACETAMINOPHEN 5-325 MG PO TABS
1.0000 | ORAL_TABLET | ORAL | Status: DC | PRN
  Administered 2017-02-27 (×3): 1 via ORAL
  Filled 2017-02-26 (×3): qty 1

## 2017-02-26 MED ORDER — PHENYLEPHRINE 40 MCG/ML (10ML) SYRINGE FOR IV PUSH (FOR BLOOD PRESSURE SUPPORT)
80.0000 ug | PREFILLED_SYRINGE | INTRAVENOUS | Status: DC | PRN
  Filled 2017-02-26: qty 5

## 2017-02-26 MED ORDER — OXYTOCIN 40 UNITS IN LACTATED RINGERS INFUSION - SIMPLE MED
2.5000 [IU]/h | INTRAVENOUS | Status: DC
  Filled 2017-02-26: qty 1000

## 2017-02-26 MED ORDER — PHENYLEPHRINE 40 MCG/ML (10ML) SYRINGE FOR IV PUSH (FOR BLOOD PRESSURE SUPPORT)
80.0000 ug | PREFILLED_SYRINGE | INTRAVENOUS | Status: DC | PRN
Start: 2017-02-26 — End: 2017-02-26
  Filled 2017-02-26: qty 5
  Filled 2017-02-26: qty 10

## 2017-02-26 MED ORDER — CLINDAMYCIN PHOSPHATE 900 MG/50ML IV SOLN
900.0000 mg | Freq: Three times a day (TID) | INTRAVENOUS | Status: DC
Start: 2017-02-26 — End: 2017-02-26
  Administered 2017-02-26: 900 mg via INTRAVENOUS
  Filled 2017-02-26 (×2): qty 50

## 2017-02-26 MED ORDER — PRENATAL MULTIVITAMIN CH
1.0000 | ORAL_TABLET | Freq: Every day | ORAL | Status: DC
  Administered 2017-02-27 – 2017-02-28 (×2): 1 via ORAL
  Filled 2017-02-26 (×3): qty 1

## 2017-02-26 MED ORDER — EPHEDRINE 5 MG/ML INJ
10.0000 mg | INTRAVENOUS | Status: DC | PRN
  Filled 2017-02-26: qty 2

## 2017-02-26 MED ORDER — LIDOCAINE HCL (PF) 1 % IJ SOLN
30.0000 mL | INTRAMUSCULAR | Status: DC | PRN
  Filled 2017-02-26: qty 30

## 2017-02-26 MED ORDER — ACETAMINOPHEN 325 MG PO TABS: 650.0000 mg | ORAL_TABLET | ORAL | Status: DC | PRN

## 2017-02-26 MED ORDER — TETANUS-DIPHTH-ACELL PERTUSSIS 5-2.5-18.5 LF-MCG/0.5 IM SUSP: 0.5000 mL | Freq: Once | INTRAMUSCULAR | Status: DC

## 2017-02-26 MED ORDER — COCONUT OIL OIL: 1.0000 "application " | TOPICAL_OIL | Status: DC | PRN

## 2017-02-26 MED ORDER — ZOLPIDEM TARTRATE 5 MG PO TABS: 5.0000 mg | ORAL_TABLET | Freq: Every evening | ORAL | Status: DC | PRN

## 2017-02-26 MED ORDER — OXYCODONE-ACETAMINOPHEN 5-325 MG PO TABS: 2.0000 | ORAL_TABLET | ORAL | Status: DC | PRN

## 2017-02-26 MED ORDER — BENZOCAINE-MENTHOL 20-0.5 % EX AERO: 1.0000 "application " | INHALATION_SPRAY | CUTANEOUS | Status: DC | PRN

## 2017-02-26 MED ORDER — DIPHENHYDRAMINE HCL 25 MG PO CAPS: 25.0000 mg | ORAL_CAPSULE | Freq: Four times a day (QID) | ORAL | Status: DC | PRN

## 2017-02-26 MED ORDER — LACTATED RINGERS IV SOLN: 500.0000 mL | Freq: Once | INTRAVENOUS | Status: DC

## 2017-02-26 MED ORDER — DIPHENHYDRAMINE HCL 50 MG/ML IJ SOLN: 12.5000 mg | INTRAMUSCULAR | Status: DC | PRN
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
## 2017-02-26 NOTE — MAU Note (Signed)
Pt presented to MAU via EMS c/o SROM and ctxs. Pt states the ctx started around 0200 this morning. Pt then reports SROM that she thinks occurred around 0445. Pt reports good FM and denies any vaginal bleeding at this time.

## 2017-02-27 LAB — CBC
HCT: 34.8 % — ABNORMAL LOW (ref 36.0–46.0)
Hemoglobin: 11.5 g/dL — ABNORMAL LOW (ref 12.0–15.0)
MCH: 29.6 pg (ref 26.0–34.0)
MCHC: 33 g/dL (ref 30.0–36.0)
MCV: 89.7 fL (ref 78.0–100.0)
PLATELETS: 130 10*3/uL — AB (ref 150–400)
RBC: 3.88 MIL/uL (ref 3.87–5.11)
RDW: 13.5 % (ref 11.5–15.5)
WBC: 8.4 10*3/uL (ref 4.0–10.5)

## 2017-02-27 MED ORDER — AMLODIPINE BESYLATE 10 MG PO TABS
10.0000 mg | ORAL_TABLET | Freq: Every day | ORAL | Status: DC
  Administered 2017-02-27 – 2017-02-28 (×2): 10 mg via ORAL
  Filled 2017-02-27 (×3): qty 1

## 2017-02-27 NOTE — Progress Notes (Signed)
POSTPARTUM PROGRESS NOTE  Post Partum Day 2 Subjective:  Katherine Hooper is a 26 y.o. G3P3003 3853w5d s/p rLTCS.    No acute events overnight beyond minor HTN for which she was started on norvasc 10.  Pt denies problems with ambulating, voiding or po intake.  No headache or visual changes.  She denies nausea or vomiting.  Pain is moderately controlled.  She has had flatus. She has not had bowel movement.  Lochia Minimal.   Objective: Blood pressure 137/87, pulse 65, temperature 98.5 F (36.9 C), temperature source Oral, resp. rate 16, height 5\' 2"  (1.575 m), weight 87.2 kg (192 lb 3.2 oz), last menstrual period 05/31/2016, unknown if currently breastfeeding.  Physical Exam:  General: alert, cooperative and no distress Lochia:normal flow Chest: no respiratory distress Heart:regular rate, distal pulses intact Abdomen: soft, nontender,  Uterine Fundus: firm, appropriately tender DVT Evaluation: No calf swelling or tenderness Extremities: no edema  Recent Labs    02/26/17 0509 02/27/17 0602  HGB 13.0 11.5*  HCT 39.6 34.8*    Assessment/Plan:  ASSESSMENT: Katherine Hooper is a 26 y.o. G3P3003 3053w5d s/p rLTCS.  Plan for discharge tomorrow   LOS: 1 day   Everette Mall BlandDO 02/27/2017, 11:12 AM

## 2017-02-27 NOTE — Lactation Note (Signed)
This note was copied from a baby's chart. Lactation Consultation Note  Patient Name: Boy Juliann MuleKaneisha Cossin ZHYQM'VToday's Date: 02/27/2017 Reason for consult: Initial assessment;Difficult latch;Infant < 6lbs;Early term 3237-38.6wks  Visited with Mom P3 (unable to breastfeed first 2 babies, pumped and bottle feed), baby 2829 hrs old. Baby  38+weeks, <6 lbs and at 5% weight loss.  Mom started supplementing baby with Neosure using curved tip syringe, baby obtained 5 ml times 2.   Mom had baby in cradle hold trying to latch baby.  Offered to assist with positioning and latching baby.  Baby unable to attain a deep areolar grasp to breast.  Baby fussy and crying.    Initiated a 20 mm nipple shield, and assisted Mom to compress breast during feeding.  Rolled up cloth diaper under breast helpful.  Hand expressed a few drops of colostrum.  Using a 5 fr feeding tube and syringe and 7 ml Neosure instilled under nipple shield.    Encouraged Mom to pump using DEBP under initiation setting.  To ask for help prn  Maternal Data Formula Feeding for Exclusion: No Has patient been taught Hand Expression?: Yes Does the patient have breastfeeding experience prior to this delivery?: Yes  Feeding Feeding Type: Formula  LATCH Score Latch: Repeated attempts needed to sustain latch, nipple held in mouth throughout feeding, stimulation needed to elicit sucking reflex.  Audible Swallowing: A few with stimulation(with supplementation)  Type of Nipple: Everted at rest and after stimulation(short nipple shaft)  Comfort (Breast/Nipple): Soft / non-tender  Hold (Positioning): Assistance needed to correctly position infant at breast and maintain latch.  LATCH Score: 7  Interventions Interventions: Breast feeding basics reviewed;Assisted with latch;Skin to skin;Breast massage;Hand express;Breast compression;Support pillows;Position options;DEBP  Lactation Tools Discussed/Used Tools: Pump;Nipple Shields;78F feeding  tube / Syringe Nipple shield size: 20 Breast pump type: Double-Electric Breast Pump Pump Review: Setup, frequency, and cleaning;Milk Storage   Consult Status Consult Status: Follow-up Date: 02/28/17 Follow-up type: In-patient    Judee ClaraSmith, Maela Takeda E 02/27/2017, 12:12 PM

## 2017-02-28 MED ORDER — SENNOSIDES-DOCUSATE SODIUM 8.6-50 MG PO TABS: 2.0000 | ORAL_TABLET | Freq: Every evening | ORAL | 1 refills | Status: DC | PRN

## 2017-02-28 MED ORDER — IBUPROFEN 600 MG PO TABS: 600.0000 mg | ORAL_TABLET | Freq: Four times a day (QID) | ORAL | 0 refills | Status: AC

## 2017-02-28 MED ORDER — AMLODIPINE BESYLATE 10 MG PO TABS: 10.0000 mg | ORAL_TABLET | Freq: Every day | ORAL | 0 refills | Status: DC

## 2017-02-28 NOTE — Discharge Summary (Signed)
OB Discharge Summary     Patient Name: Katherine Hooper DOB: 08/26/1990 MRN: [id number]  Date of admission: 02/26/2017 Delivering MD: Rolm BookbinderNEILL, CAROLINE M   Date of discharge: 02/28/2017  Admitting diagnosis: 38wks water broke ctx 3 to 4 mins apart Intrauterine pregnancy: 717w5d     Secondary diagnosis:  Active Problems:   Normal labor and delivery  Additional problems: Gestational HTN     Discharge diagnosis: Term Pregnancy Delivered                                                                                                Post partum procedures:None  Augmentation: None  Complications: None  Hospital course:  Onset of Labor With Vaginal Delivery     26 y.o. yo G3P3003 at 6717w5d was admitted in Active Labor on 02/26/2017. Patient had an uncomplicated labor course as follows:  Membrane Rupture Time/Date: 4:30 AM ,02/26/2017   Intrapartum Procedures: Episiotomy: None [1]                                         Lacerations:  1st degree [2];Perineal [11]  Patient had a delivery of a Viable infant. 02/26/2017  Information for the patient's newborn:  Criselda Peacheshompson, Boy Devine [[id number]][[id number]]  Delivery Method: Vaginal, Spontaneous(Filed from Delivery Summary)    Pateint had an uncomplicated postpartum course.  She is ambulating, tolerating a regular diet, and urinating well. She has not passed flatus or had a BM since giving birth. She was started on senna-docusate for this on 12/13. Patient is discharged home in stable condition on 02/28/17.   Physical exam  Vitals:   02/27/17 0553 02/27/17 1737 02/28/17 0004 02/28/17 0754  BP: 137/87 127/81 140/87 135/87  Pulse: 65 94 88 84  Resp: 16 16 18 16   Temp: 98.5 F (36.9 C) 98 F (36.7 C) (!) 97.5 F (36.4 C) 98.3 F (36.8 C)  TempSrc: Oral Oral Oral Oral  SpO2:    100%  Weight: 87.2 kg (192 lb 3.2 oz)   86 kg (189 lb 9.6 oz)  Height:       General: alert, cooperative and no distress Lochia: appropriate Uterine Fundus:  firm Incision: N/A DVT Evaluation: No evidence of DVT seen on physical exam. Labs: Lab Results  Component Value Date   WBC 8.4 02/27/2017   HGB 11.5 (L) 02/27/2017   HCT 34.8 (L) 02/27/2017   MCV 89.7 02/27/2017   PLT 130 (L) 02/27/2017   CMP Latest Ref Rng & Units 04/28/2013  Glucose 70 - 99 mg/dL 91  BUN 6 - 23 mg/dL 12  Creatinine 0.860.50 - 5.781.10 mg/dL 4.690.60  Sodium 629137 - 528147 mEq/L 141  Potassium 3.7 - 5.3 mEq/L 3.0(L)  Chloride 96 - 112 mEq/L 104    Discharge instruction: per After Visit Summary and "Baby and Me Booklet".  After visit meds:  Allergies as of 02/28/2017      Reactions   Penicillins Hives   Has patient had a PCN reaction causing immediate rash, facial/tongue/throat swelling, SOB or  lightheadedness with hypotension: yes (pt states she had hives and SOB) Has patient had a PCN reaction causing severe rash involving mucus membranes or skin necrosis: no  Has patient had a PCN reaction that required hospitalization: yes Has patient had a PCN reaction occurring within the last 10 years: no If all of the above answers are "NO", then may proceed with Cephalosporin use.      Medication List    STOP taking these medications   COMFORT FIT MATERNITY SUPP LG Misc   famotidine 20 MG tablet Commonly known as:  PEPCID   ondansetron 8 MG disintegrating tablet Commonly known as:  ZOFRAN ODT   promethazine 12.5 MG tablet Commonly known as:  PHENERGAN   terconazole 0.4 % vaginal cream Commonly known as:  TERAZOL 7     TAKE these medications   amLODipine 10 MG tablet Commonly known as:  NORVASC Take 1 tablet (10 mg total) by mouth daily.   ibuprofen 600 MG tablet Commonly known as:  ADVIL,MOTRIN Take 1 tablet (600 mg total) by mouth every 6 (six) hours.   multivitamin-prenatal 27-0.8 MG Tabs tablet Take 1 tablet by mouth daily at 12 noon.   senna-docusate 8.6-50 MG tablet Commonly known as:  Senokot-S Take 2 tablets by mouth at bedtime as needed for mild  constipation.       Diet: routine diet  Activity: Advance as tolerated. Pelvic rest for 6 weeks.   Outpatient follow ZO:XWRUEAVup:Patient will have BP check in 1 week Follow up Appt:No future appointments. Follow up Visit:No Follow-up on file.  Postpartum contraception: IUD as outpatient  Newborn Data: Live born female  Birth Weight: 5 lb 12.6 oz (2625 g) APGAR: 9, 9  Newborn Delivery   Birth date/time:  02/26/2017 06:04:00 Delivery type:  Vaginal, Spontaneous     Baby Feeding: Breast Disposition:home with mother   02/28/2017 Francie MassingMelissa  Brown, Medical Student

## 2017-02-28 NOTE — Lactation Note (Signed)
This note was copied from a baby's chart. Lactation Consultation Note  Patient Name: Boy Juliann MuleKaneisha Parrow OZHYQ'MToday's Date: 02/28/2017 Reason for consult: Follow-up assessment  Baby is 4954 hours old LC reviewed doc flow sheets. Melissa Queen updated  [place] on the last 24 hour feedings .  LC read her note.  LC reviewed potential feeding behaviors of an early term baby, less than 6 pounds.  Per mom really wants to breast feed, having challenges with the SNS and NS.  LC assessed breast tissue and noted the areolas to edematous and semi compressible.  #20 NS was to small, #24 NS fit better, LC instill formula for an appetizer and in spilled out of the base Of the NS. LC assisted to latch with the NS and abby was able to accommodate the #24 NS , no swallows  Noted and baby got sluggish, LC unlatched the baby. LC assisted mom to latch without the NS with tea hold  Position and baby latched for 10 mins with multiple swallows, increased with breast compressions.  Baby fell asleep and released, nipple slightly slanted.  LC showed mom how to PACE feed the baby and the baby took 12 ml of formula. Baby acting more awake  And mom wanted to try to latch again and the baby didn't seem hungry.  Sore nipple and engorgement prevention and tx reviewed. LC instructed mom on the use shells to enhance  Compressibility of the areolas.  Per mom will have a DEBP at home this afternoon.  LC encouraged mom to post pump after every feeding for 10 -15 mins , save milk and feed it back to baby.  LC also recommended to feed with feeding cues , STS until the baby is up to birth weight.  Feed 1st breast for 15 -20 mins , supplement 25 -30 ml EBM or formula, and post pump both breast.    Maternal Data Has patient been taught Hand Expression?: Yes  Feeding Feeding Type: Formula(PACE fed and instructed mom ) Nipple Type: Slow - flow Length of feed: 10 min  LATCH Score Latch: Grasps breast easily, tongue down, lips  flanged, rhythmical sucking.  Audible Swallowing: Spontaneous and intermittent  Type of Nipple: Everted at rest and after stimulation(semi compressible - swelling )  Comfort (Breast/Nipple): Filling, red/small blisters or bruises, mild/mod discomfort  Hold (Positioning): Assistance needed to correctly position infant at breast and maintain latch.  LATCH Score: 8  Interventions Interventions: Breast feeding basics reviewed  Lactation Tools Discussed/Used Tools: 66F feeding tube / Syringe Nipple shield size: 24;20;Other (comment)(resized NS , and the #24 NS fit best ) WIC Program: No   Consult Status Consult Status: Follow-up Date: ([facility] in epic for clinic to call , mom aware / receptive ) Follow-up type: Out-patient    Matilde SprangMargaret Ann Jasie Meleski 02/28/2017, 12:18 PM

## 2017-02-28 NOTE — Discharge Instructions (Signed)

## 2017-03-04 ENCOUNTER — Encounter: Payer: Medicaid Other | Admitting: Advanced Practice Midwife

## 2017-04-08 ENCOUNTER — Encounter: Payer: Self-pay | Admitting: Advanced Practice Midwife

## 2017-04-08 ENCOUNTER — Ambulatory Visit (INDEPENDENT_AMBULATORY_CARE_PROVIDER_SITE_OTHER): Payer: Medicaid Other | Admitting: Advanced Practice Midwife

## 2017-04-08 DIAGNOSIS — Z30430 Encounter for insertion of intrauterine contraceptive device: Secondary | ICD-10-CM

## 2017-04-08 DIAGNOSIS — Z1389 Encounter for screening for other disorder: Secondary | ICD-10-CM

## 2017-04-08 DIAGNOSIS — Z3009 Encounter for other general counseling and advice on contraception: Secondary | ICD-10-CM

## 2017-04-08 LAB — POCT PREGNANCY, URINE: Preg Test, Ur: NEGATIVE

## 2017-04-08 MED ORDER — NAPROXEN 500 MG PO TABS: 500.0000 mg | ORAL_TABLET | Freq: Two times a day (BID) | ORAL | 2 refills | Status: DC

## 2017-04-08 NOTE — Patient Instructions (Signed)
IUD PLACEMENT POST-PROCEDURE INSTRUCTIONS  1. You may take Ibuprofen, Aleve or Tylenol for pain if needed.  Cramping should resolve within in 24 hours.  2. You may have a small amount of spotting.  You should wear a mini pad for the next few days.  3. You may have intercourse after 24 hours.  If you using this for birth control, it is effective immediately.  4. You need to call if you have any pelvic pain, fever, heavy bleeding or foul smelling vaginal discharge.  Irregular bleeding is common the first several months after having an IUD placed. You do not need to call for this reason unless you are concerned.  5. Shower or bathe as normal  6. You can have a follow-up appointment in 4-8 weeks for a re-check to make sure you are not having any problems.

## 2017-04-08 NOTE — Progress Notes (Signed)
Subjective:     Katherine Hooper is a 27 y.o. female who presents for a postpartum visit. She is 5 weeks postpartum following a spontaneous vaginal delivery. I have fully reviewed the prenatal and intrapartum course. The delivery was at 38 gestational weeks. Outcome: spontaneous vaginal delivery. Anesthesia: none. Postpartum course has been uncomplicated. Baby's course has been uncomplicated. Baby is feeding by both breast and bottle - Kandis CockingGerberb Good Start. Bleeding staining only. Bowel function is normal. Bladder function is normal. Patient is not sexually active. Contraception method is none. Postpartum depression screening: negative.  The following portions of the patient's history were reviewed and updated as appropriate: allergies, current medications, past family history, past medical history, past social history, past surgical history and problem list.  Review of Systems Pertinent items are noted in HPI.   Objective:    There were no vitals taken for this visit.  General:  alert, cooperative and appears stated age   Breasts:  negative  Lungs: normal work of breathing   Heart:  regular rate and rhythm  Abdomen: soft, non-tender; bowel sounds normal; no masses,  no organomegaly   Vulva:  normal  Vagina: normal vagina  Cervix:  multiparous appearance  Corpus: normal  Adnexa:  normal adnexa  Rectal Exam: Not performed.         GYNECOLOGY OFFICE PROCEDURE NOTE  Katherine Hooper is a 27 y.o. G3P3003 here for Liletta IUD insertion. No GYN concerns.  Last pap smear was on 08/30/16 and was normal.  IUD Insertion Procedure Note Patient identified, informed consent performed, consent signed.   Discussed risks of irregular bleeding, cramping, infection, malpositioning or misplacement of the IUD outside the uterus which may require further procedure such as laparoscopy. Time out was performed.  No intercourse since delivery.  Speculum placed in the vagina.  Cervix visualized.  Cleaned with  Betadine x 2.  Grasped anteriorly with a single tooth tenaculum.  Uterus sounded to 10 cm.  Liletta IUD placed per manufacturer's recommendations.  Strings trimmed to 3 cm. Tenaculum was removed, good hemostasis noted.  Patient tolerated procedure well.   Patient was given post-procedure instructions.  She was advised to have backup contraception for one week.  Patient was also asked to check IUD strings periodically and follow up in 4 weeks for IUD check.   Assessment:     6 postpartum exam. Pap smear not done at today's visit.   Plan:    1. Contraception: IUD 2. Routine care 3. Follow up in: 1 year or as needed.

## 2017-04-09 ENCOUNTER — Encounter: Payer: Self-pay | Admitting: *Deleted

## 2017-07-22 ENCOUNTER — Encounter: Payer: Self-pay | Admitting: *Deleted

## 2017-07-22 ENCOUNTER — Ambulatory Visit: Payer: Medicaid Other | Admitting: Advanced Practice Midwife

## 2017-07-22 NOTE — Progress Notes (Signed)
Juliann Mule did not keep her scheduled appointment for follow up. Per discussion with Thressa Sheller, CNM does not need to be called, may reschedule if she calls.

## 2018-02-12 ENCOUNTER — Ambulatory Visit (HOSPITAL_COMMUNITY)
Admission: EM | Admit: 2018-02-12 | Discharge: 2018-02-12 | Disposition: A | Discharge status: Home / Self Care | Payer: Medicaid Other | Attending: Family Medicine | Admitting: Family Medicine

## 2018-02-12 ENCOUNTER — Encounter (HOSPITAL_COMMUNITY): Payer: Self-pay

## 2018-02-12 DIAGNOSIS — H1033 Unspecified acute conjunctivitis, bilateral: Secondary | ICD-10-CM

## 2018-02-12 MED ORDER — OLOPATADINE HCL 0.2 % OP SOLN: 1.0000 [drp] | Freq: Every day | OPHTHALMIC | 0 refills | Status: AC

## 2018-02-12 NOTE — ED Triage Notes (Signed)
Pt present both eyes are red, itching and very watery.  Pt states this started on Saturday.
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
## 2018-02-24 ENCOUNTER — Encounter (HOSPITAL_COMMUNITY): Payer: Self-pay

## 2018-02-24 ENCOUNTER — Emergency Department (HOSPITAL_COMMUNITY)
Admission: EM | Admit: 2018-02-24 | Discharge: 2018-02-24 | Disposition: A | Discharge status: Home / Self Care | Payer: Medicaid Other | Attending: Emergency Medicine | Admitting: Emergency Medicine

## 2018-02-24 DIAGNOSIS — Z87891 Personal history of nicotine dependence: Secondary | ICD-10-CM | POA: Insufficient documentation

## 2018-02-24 DIAGNOSIS — L03011 Cellulitis of right finger: Secondary | ICD-10-CM

## 2018-02-24 MED ORDER — LIDOCAINE HCL (PF) 1 % IJ SOLN
5.0000 mL | Freq: Once | INTRAMUSCULAR | Status: AC
  Administered 2018-02-24: 5 mL via INTRADERMAL
  Filled 2018-02-24: qty 5

## 2018-02-24 MED ORDER — DOXYCYCLINE HYCLATE 100 MG PO TABS
100.0000 mg | ORAL_TABLET | Freq: Once | ORAL | Status: AC
  Administered 2018-02-24: 100 mg via ORAL
  Filled 2018-02-24: qty 1

## 2018-02-24 MED ORDER — DOXYCYCLINE HYCLATE 100 MG PO CAPS: 100.0000 mg | ORAL_CAPSULE | Freq: Two times a day (BID) | ORAL | 0 refills | Status: DC

## 2018-02-24 NOTE — ED Provider Notes (Signed)
[facility] EMERGENCY DEPARTMENT Provider Note   CSN: [id number] Arrival date & time: 02/24/18  0054     History   Chief Complaint No chief complaint on file.   HPI Katherine Hooper is a 27 y.o. female.  Patient presents to the emergency department with a chief complaint of right thumb infection.  She states that she poked her thumb with a hair tool about a week ago.  Since then, she has noticed some swelling around her thumbnail with some draining pus.  It is tender to palpation.  She denies any fevers or chills.  Denies any treatments prior to arrival.  The history is provided by the patient. No language interpreter was used.    Past Medical History:  Diagnosis Date  . Abnormal Pap smear   . Anemia    during last pregnancy  . Chlamydia   . GERD (gastroesophageal reflux disease)   . Group B streptococcal infection in pregnancy 2009  . HPV (human papilloma virus) anogenital infection   . Infection    BV x 2;currently on Flagyl for BV  . Scoliosis   . Scoliosis     Patient Active Problem List   Diagnosis Date Noted  . Normal labor and delivery 02/26/2017  . Nausea and vomiting in pregnancy prior to [redacted] weeks gestation 09/25/2016  . Supervision of other normal pregnancy, antepartum 08/30/2016  . GERD (gastroesophageal reflux disease) 02/13/2012  . Allergy to penicillin 01/09/2012    Past Surgical History:  Procedure Laterality Date  . NO PAST SURGERIES       OB History    Gravida  3   Para  3   Term  3   Preterm  0   AB  0   Living  3     SAB  0   TAB  0   Ectopic  0   Multiple  0   Live Births  3            Home Medications    Prior to Admission medications   Medication Sig Start Date End Date Taking? Authorizing Provider  ibuprofen (ADVIL,MOTRIN) 600 MG tablet Take 1 tablet (600 mg total) by mouth every 6 (six) hours. 02/28/17   Pincus Large, DO  Olopatadine HCl 0.2 % SOLN Apply 1 drop to eye daily. 02/12/18    Mardella Layman, MD    Family History Family History  Problem Relation Age of Onset  . Hypertension Maternal Grandmother   . Diabetes Maternal Grandmother   . Asthma Maternal Grandmother   . Kidney disease Maternal Grandmother        Dialysis  . Hypertension Maternal Grandfather   . Diabetes Maternal Grandfather   . Stroke Maternal Grandfather   . Hypertension Paternal Grandmother   . Hypertension Paternal Grandfather   . Asthma Cousin        Maternal  . Other Neg Hx     Social History Social History   Tobacco Use  . Smoking status: Former Smoker    Packs/day: 0.25    Years: 10.00    Pack years: 2.50    Types: Cigarettes    Last attempt to quit: 09/17/2011    Years since quitting: 6.4  . Smokeless tobacco: Never Used  Substance Use Topics  . Alcohol use: No  . Drug use: No     Allergies   Penicillins   Review of Systems Review of Systems  All other systems reviewed and are negative.  Physical Exam Updated Vital Signs BP 128/60   Pulse 92   Temp 98.3 F (36.8 C) (Oral)   Resp 16   Ht 5\' 2"  (1.575 m)   Wt 83 kg   SpO2 99%   BMI 33.47 kg/m   Physical Exam  Constitutional: She is oriented to person, place, and time. No distress.  HENT:  Head: Normocephalic and atraumatic.  Eyes: Pupils are equal, round, and reactive to light. Conjunctivae and EOM are normal.  Neck: No tracheal deviation present.  Cardiovascular: Normal rate.  Pulmonary/Chest: Effort normal. No respiratory distress.  Abdominal: Soft.  Musculoskeletal: Normal range of motion.  Neurological: She is alert and oriented to person, place, and time.  Skin: Skin is warm and dry. She is not diaphoretic.  Paronychia to radial aspect of right thumb  Psychiatric: Judgment normal.  Nursing note and vitals reviewed.    ED Treatments / Results  Labs (all labs ordered are listed, but only abnormal results are displayed) Labs Reviewed - No data to display  EKG None  Radiology No  results found.  Procedures Procedures (including critical care time) INCISION AND DRAINAGE Performed by: Roxy Horsemanobert Kailan Laws Consent: Verbal consent obtained. Risks and benefits: risks, benefits and alternatives were discussed Type: abscess  Body area: right thumb  Anesthesia: local infiltration  Incision was made with a scalpel.  Local anesthetic: lidocaine 1% without epinephrine  Anesthetic total: 1 ml  Complexity: complex Blunt dissection to break up loculations  Drainage: purulent  Drainage amount: mild  Packing material: not packed  Patient tolerance: Patient tolerated the procedure well with no immediate complications.    Medications Ordered in ED Medications  lidocaine (PF) (XYLOCAINE) 1 % injection 5 mL (has no administration in time range)     Initial Impression / Assessment and Plan / ED Course  I have reviewed the triage vital signs and the nursing notes.  Pertinent labs & imaging results that were available during my care of the patient were reviewed by me and considered in my medical decision making (see chart for details).     Patient with skin abscess amenable to incision and drainage.  Abscess was not large enough to warrant packing or drain,  wound recheck in 2 days. Encouraged home warm soaks and flushing.  Mild signs of cellulitis is surrounding skin.  Will d/c to home.    Final Clinical Impressions(s) / ED Diagnoses   Final diagnoses:  Paronychia of finger of right hand    ED Discharge Orders         Ordered    doxycycline (VIBRAMYCIN) 100 MG capsule  2 times daily     02/24/18 0120           Roxy HorsemanBrowning, Juquan Reznick, PA-C 02/24/18 0122    Nira Connardama, Pedro Eduardo, MD 02/24/18 [phone]

## 2018-02-24 NOTE — ED Triage Notes (Signed)
Pt. Reports sticking right thumb with hair tool a week ago. Some puss and blood has been noted. Pt. Tried to drain injury but was unsuccessful.

## 2018-04-06 ENCOUNTER — Emergency Department (HOSPITAL_COMMUNITY)
Admission: EM | Admit: 2018-04-06 | Discharge: 2018-04-06 | Disposition: A | Discharge status: Home / Self Care | Payer: Self-pay | Attending: Emergency Medicine | Admitting: Emergency Medicine

## 2018-04-06 ENCOUNTER — Encounter (HOSPITAL_COMMUNITY): Payer: Self-pay

## 2018-04-06 DIAGNOSIS — Z87891 Personal history of nicotine dependence: Secondary | ICD-10-CM | POA: Insufficient documentation

## 2018-04-06 DIAGNOSIS — Z79899 Other long term (current) drug therapy: Secondary | ICD-10-CM | POA: Insufficient documentation

## 2018-04-06 DIAGNOSIS — K2970 Gastritis, unspecified, without bleeding: Secondary | ICD-10-CM | POA: Insufficient documentation

## 2018-04-06 LAB — COMPREHENSIVE METABOLIC PANEL
ALK PHOS: 55 U/L (ref 38–126)
ALT: 13 U/L (ref 0–44)
AST: 16 U/L (ref 15–41)
Albumin: 3.2 g/dL — ABNORMAL LOW (ref 3.5–5.0)
Anion gap: 7 (ref 5–15)
BUN: 9 mg/dL (ref 6–20)
CALCIUM: 8.7 mg/dL — AB (ref 8.9–10.3)
CO2: 26 mmol/L (ref 22–32)
CREATININE: 0.78 mg/dL (ref 0.44–1.00)
Chloride: 106 mmol/L (ref 98–111)
GFR calc Af Amer: >60 mL/min (ref >60)
Glucose, Bld: 99 mg/dL (ref 70–99)
Potassium: 3.5 mmol/L (ref 3.5–5.1)
Sodium: 139 mmol/L (ref 135–145)
Total Bilirubin: 0.4 mg/dL (ref 0.3–1.2)
Total Protein: 6.1 g/dL — ABNORMAL LOW (ref 6.5–8.1)

## 2018-04-06 LAB — LIPASE, BLOOD: LIPASE: 27 U/L (ref 11–51)

## 2018-04-06 LAB — CBC WITH DIFFERENTIAL/PLATELET
Abs Immature Granulocytes: 0.02 10*3/uL (ref 0.00–0.07)
BASOS PCT: 0 %
Basophils Absolute: 0 10*3/uL (ref 0.0–0.1)
EOS ABS: 0.1 10*3/uL (ref 0.0–0.5)
EOS PCT: 1 %
HCT: 43.1 % (ref 36.0–46.0)
Hemoglobin: 13.5 g/dL (ref 12.0–15.0)
Immature Granulocytes: 0 %
Lymphocytes Relative: 17 %
Lymphs Abs: 1.3 10*3/uL (ref 0.7–4.0)
MCH: 29.3 pg (ref 26.0–34.0)
MCHC: 31.3 g/dL (ref 30.0–36.0)
MCV: 93.5 fL (ref 80.0–100.0)
Monocytes Absolute: 0.4 10*3/uL (ref 0.1–1.0)
Monocytes Relative: 5 %
Neutro Abs: 6.1 10*3/uL (ref 1.7–7.7)
Neutrophils Relative %: 77 %
PLATELETS: 194 10*3/uL (ref 150–400)
RBC: 4.61 MIL/uL (ref 3.87–5.11)
RDW: 12.3 % (ref 11.5–15.5)
WBC: 7.9 10*3/uL (ref 4.0–10.5)
nRBC: 0 % (ref 0.0–0.2)

## 2018-04-06 LAB — URINALYSIS, ROUTINE W REFLEX MICROSCOPIC
Bilirubin Urine: NEGATIVE
Glucose, UA: NEGATIVE mg/dL
Hgb urine dipstick: NEGATIVE
Ketones, ur: NEGATIVE mg/dL
LEUKOCYTES UA: NEGATIVE
Nitrite: NEGATIVE
Protein, ur: NEGATIVE mg/dL
Specific Gravity, Urine: 1.021 (ref 1.005–1.030)
pH: 6 (ref 5.0–8.0)

## 2018-04-06 LAB — I-STAT TROPONIN, ED: Troponin i, poc: 0 ng/mL (ref 0.00–0.08)

## 2018-04-06 LAB — I-STAT BETA HCG BLOOD, ED (MC, WL, AP ONLY): I-stat hCG, quantitative: <5 m[IU]/mL (ref <5)

## 2018-04-06 MED ORDER — DICYCLOMINE HCL 10 MG PO CAPS
10.0000 mg | ORAL_CAPSULE | Freq: Once | ORAL | Status: AC
  Administered 2018-04-06: 10 mg via ORAL
  Filled 2018-04-06: qty 1

## 2018-04-06 MED ORDER — FAMOTIDINE IN NACL 20-0.9 MG/50ML-% IV SOLN
20.0000 mg | Freq: Once | INTRAVENOUS | Status: AC
  Administered 2018-04-06: 20 mg via INTRAVENOUS
  Filled 2018-04-06: qty 50

## 2018-04-06 MED ORDER — KETOROLAC TROMETHAMINE 30 MG/ML IJ SOLN: 15.0000 mg | Freq: Once | INTRAMUSCULAR | Status: DC

## 2018-04-06 MED ORDER — DICYCLOMINE HCL 10 MG/ML IM SOLN: 20.0000 mg | Freq: Once | INTRAMUSCULAR | Status: DC

## 2018-04-06 MED ORDER — FAMOTIDINE 20 MG PO TABS: 20.0000 mg | ORAL_TABLET | Freq: Two times a day (BID) | ORAL | 0 refills | Status: AC

## 2018-04-06 MED ORDER — KETOROLAC TROMETHAMINE 30 MG/ML IJ SOLN
15.0000 mg | Freq: Once | INTRAMUSCULAR | Status: AC
  Administered 2018-04-06: 15 mg via INTRAVENOUS
  Filled 2018-04-06: qty 1

## 2018-04-06 MED ORDER — DICYCLOMINE HCL 20 MG PO TABS: 20.0000 mg | ORAL_TABLET | Freq: Two times a day (BID) | ORAL | 0 refills | Status: AC

## 2018-04-06 MED ORDER — SODIUM CHLORIDE 0.9 % IV BOLUS
1000.0000 mL | Freq: Once | INTRAVENOUS | Status: AC
  Administered 2018-04-06: 1000 mL via INTRAVENOUS

## 2018-04-06 MED ORDER — ONDANSETRON 4 MG PO TBDP: 4.0000 mg | ORAL_TABLET | Freq: Three times a day (TID) | ORAL | 0 refills | Status: AC | PRN

## 2018-04-06 NOTE — ED Notes (Signed)
ED Provider at bedside. 

## 2018-04-06 NOTE — ED Triage Notes (Signed)
Pt endorses right sided intermittent abdominal pain x3 days. Pt denies any bleeding or discharge. Pt has IUD since summer. Pt is alert, oriented, and ambulatory.

## 2018-04-06 NOTE — ED Notes (Signed)
Patient verbalizes understanding of discharge instructions. Opportunity for questioning and answers were provided. Armband removed by staff, pt discharged from ED. Pt ambulatory to lobby.  

## 2018-04-06 NOTE — Discharge Instructions (Signed)
Return to ED for worsening symptoms, severe abdominal pain, chest pain, shortness of breath, vomiting or coughing up blood.

## 2018-04-06 NOTE — ED Provider Notes (Signed)
[facility] EMERGENCY DEPARTMENT Provider Note   CSN: [id number] Arrival date & time: 04/06/18  1610     History   Chief Complaint Chief Complaint  Patient presents with  . Abdominal Pain    HPI Katherine Hooper is a 28 y.o. female with a past medical history of GERD, who presents to ED for evaluation of 3-day history of right middle and right upper quadrant abdominal pain.  Describes the pain as aching, cramping, similar to menstrual cramps but different in location.  She has had an IUD for the past 6 to 7 months.  She notes spotting like menstrual period earlier in the month which is unusual for her.  She had a normal bowel movement yesterday.  She denies any nausea, vomiting, urinary symptoms, prior abdominal surgeries.  She reports occasional alcohol use but denies any tobacco or other drug use.  HPI  Past Medical History:  Diagnosis Date  . Abnormal Pap smear   . Anemia    during last pregnancy  . Chlamydia   . GERD (gastroesophageal reflux disease)   . Group B streptococcal infection in pregnancy 2009  . HPV (human papilloma virus) anogenital infection   . Infection    BV x 2;currently on Flagyl for BV  . Scoliosis   . Scoliosis     Patient Active Problem List   Diagnosis Date Noted  . Normal labor and delivery 02/26/2017  . Nausea and vomiting in pregnancy prior to [redacted] weeks gestation 09/25/2016  . Supervision of other normal pregnancy, antepartum 08/30/2016  . GERD (gastroesophageal reflux disease) 02/13/2012  . Allergy to penicillin 01/09/2012    Past Surgical History:  Procedure Laterality Date  . NO PAST SURGERIES       OB History    Gravida  3   Para  3   Term  3   Preterm  0   AB  0   Living  3     SAB  0   TAB  0   Ectopic  0   Multiple  0   Live Births  3            Home Medications    Prior to Admission medications   Medication Sig Start Date End Date Taking? Authorizing Provider  dicyclomine  (BENTYL) 20 MG tablet Take 1 tablet (20 mg total) by mouth 2 (two) times daily. 04/06/18   Ayden Apodaca, PA-C  doxycycline (VIBRAMYCIN) 100 MG capsule Take 1 capsule (100 mg total) by mouth 2 (two) times daily. 02/24/18   Roxy Horseman, PA-C  famotidine (PEPCID) 20 MG tablet Take 1 tablet (20 mg total) by mouth 2 (two) times daily. 04/06/18   Laiah Pouncey, PA-C  ibuprofen (ADVIL,MOTRIN) 600 MG tablet Take 1 tablet (600 mg total) by mouth every 6 (six) hours. 02/28/17   Pincus Large, DO  Olopatadine HCl 0.2 % SOLN Apply 1 drop to eye daily. 02/12/18   Mardella Layman, MD  ondansetron (ZOFRAN ODT) 4 MG disintegrating tablet Take 1 tablet (4 mg total) by mouth every 8 (eight) hours as needed for nausea or vomiting. 04/06/18   Dietrich Pates, PA-C    Family History Family History  Problem Relation Age of Onset  . Hypertension Maternal Grandmother   . Diabetes Maternal Grandmother   . Asthma Maternal Grandmother   . Kidney disease Maternal Grandmother        Dialysis  . Hypertension Maternal Grandfather   . Diabetes Maternal Grandfather   . Stroke  Maternal Grandfather   . Hypertension Paternal Grandmother   . Hypertension Paternal Grandfather   . Asthma Cousin        Maternal  . Other Neg Hx     Social History Social History   Tobacco Use  . Smoking status: Former Smoker    Packs/day: 0.25    Years: 10.00    Pack years: 2.50    Types: Cigarettes    Last attempt to quit: 09/17/2011    Years since quitting: 6.5  . Smokeless tobacco: Never Used  Substance Use Topics  . Alcohol use: No  . Drug use: No     Allergies   Penicillins   Review of Systems Review of Systems  Constitutional: Negative for appetite change, chills and fever.  HENT: Negative for ear pain, rhinorrhea, sneezing and sore throat.   Eyes: Negative for photophobia and visual disturbance.  Respiratory: Negative for cough, chest tightness, shortness of breath and wheezing.   Cardiovascular: Negative for chest  pain and palpitations.  Gastrointestinal: Positive for abdominal pain. Negative for blood in stool, constipation, diarrhea, nausea and vomiting.  Genitourinary: Negative for dysuria, hematuria and urgency.  Musculoskeletal: Negative for myalgias.  Skin: Negative for rash.  Neurological: Negative for dizziness, weakness and light-headedness.     Physical Exam Updated Vital Signs BP 111/78   Pulse (!) 102   Temp 98.2 F (36.8 C) (Oral)   Resp 16   SpO2 100%   Physical Exam Vitals signs and nursing note reviewed.  Constitutional:      General: She is not in acute distress.    Appearance: She is well-developed.  HENT:     Head: Normocephalic and atraumatic.     Nose: Nose normal.  Eyes:     General: No scleral icterus.       Left eye: No discharge.     Conjunctiva/sclera: Conjunctivae normal.  Neck:     Musculoskeletal: Normal range of motion and neck supple.  Cardiovascular:     Rate and Rhythm: Normal rate and regular rhythm.     Heart sounds: Normal heart sounds. No murmur. No friction rub. No gallop.   Pulmonary:     Effort: Pulmonary effort is normal. No respiratory distress.     Breath sounds: Normal breath sounds.  Abdominal:     General: Bowel sounds are normal. There is no distension.     Palpations: Abdomen is soft.     Tenderness: There is abdominal tenderness in the right upper quadrant. There is no guarding.  Musculoskeletal: Normal range of motion.  Skin:    General: Skin is warm and dry.     Findings: No rash.  Neurological:     Mental Status: She is alert.     Motor: No abnormal muscle tone.     Coordination: Coordination normal.      ED Treatments / Results  Labs (all labs ordered are listed, but only abnormal results are displayed) Labs Reviewed  COMPREHENSIVE METABOLIC PANEL - Abnormal; Notable for the following components:      Result Value   Calcium 8.7 (*)    Total Protein 6.1 (*)    Albumin 3.2 (*)    All other components within normal  limits  LIPASE, BLOOD  CBC WITH DIFFERENTIAL/PLATELET  URINALYSIS, ROUTINE W REFLEX MICROSCOPIC  I-STAT BETA HCG BLOOD, ED ([facility], [facility], [facility] ONLY)  I-STAT TROPONIN, ED    EKG None  Radiology No results found.  Procedures Procedures (including critical care time)  Medications Ordered in  ED Medications  sodium chloride 0.9 % bolus 1,000 mL (0 mLs Intravenous Stopped 04/06/18 1807)  famotidine (PEPCID) IVPB 20 mg premix (0 mg Intravenous Stopped 04/06/18 1838)  ketorolac (TORADOL) 30 MG/ML injection 15 mg (15 mg Intravenous Given 04/06/18 1804)  dicyclomine (BENTYL) capsule 10 mg (10 mg Oral Given 04/06/18 1805)     Initial Impression / Assessment and Plan / ED Course  I have reviewed the triage vital signs and the nursing notes.  Pertinent labs & imaging results that were available during my care of the patient were reviewed by me and considered in my medical decision making (see chart for details).     28 year old female presents to ED for 3-day history of right middle and right upper quadrant abdominal pain.  She denies any nausea, vomiting, changes to bowel movements, urinary symptoms or prior abdominal surgeries.  On my exam there is tenderness palpation of the right upper quadrant and right middle quadrant.  No rebound or guarding noted.  Vital signs are within normal limits.  CMP, CBC, lipase, urinalysis and hCG were unremarkable.  Troponin was done erroneously by lab and was negative.  She denies any chest pain.  She was given Pepcid, Toradol and Bentyl with significant improvement in her symptoms.  She does complain of ongoing discomfort but exam has improved.  Suspect that her symptoms are due to gastritis.  I see no need for imaging at this time as her labs are within normal limits.  Will advise her to take medications as needed and to follow-up with GI if symptoms persist. Advised to return to ED for any severe or worsening symptoms.  Patient is hemodynamically stable, in NAD, and  able to ambulate in the ED. Evaluation does not show pathology that would require ongoing emergent intervention or inpatient treatment. I explained the diagnosis to the patient. Pain has been managed and has no complaints prior to discharge. Patient is comfortable with above plan and is stable for discharge at this time. All questions were answered prior to disposition. Strict return precautions for returning to the ED were discussed. Encouraged follow up with PCP.    Portions of this note were generated with Scientist, clinical (histocompatibility and immunogenetics)Dragon dictation software. Dictation errors may occur despite best attempts at proofreading.   Final Clinical Impressions(s) / ED Diagnoses   Final diagnoses:  Gastritis without bleeding, unspecified chronicity, unspecified gastritis type    ED Discharge Orders         Ordered    famotidine (PEPCID) 20 MG tablet  2 times daily     04/06/18 1905    dicyclomine (BENTYL) 20 MG tablet  2 times daily     04/06/18 1905    ondansetron (ZOFRAN ODT) 4 MG disintegrating tablet  Every 8 hours PRN     04/06/18 1905           Dietrich PatesKhatri, Eleanore Junio, PA-C 04/06/18 1907    Derwood KaplanNanavati, Ankit, MD 04/08/18 2326

## 2019-03-20 IMAGING — US US MFM OB DETAIL+14 WK
1 series · 14 of 28 positions shown · non-contrast
Comparison: none

[Series 1: us mfm ob detail+14 wk · 122 acquisitions, 14 frames shown]
[im 5/122]
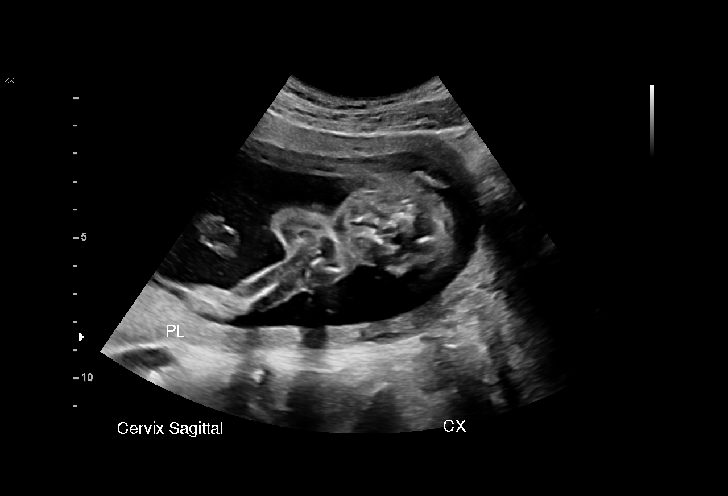
[im 14/122]
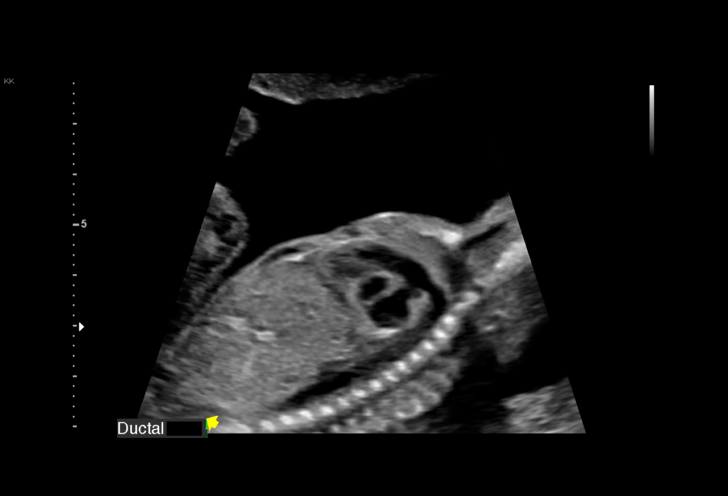
[im 23/122]
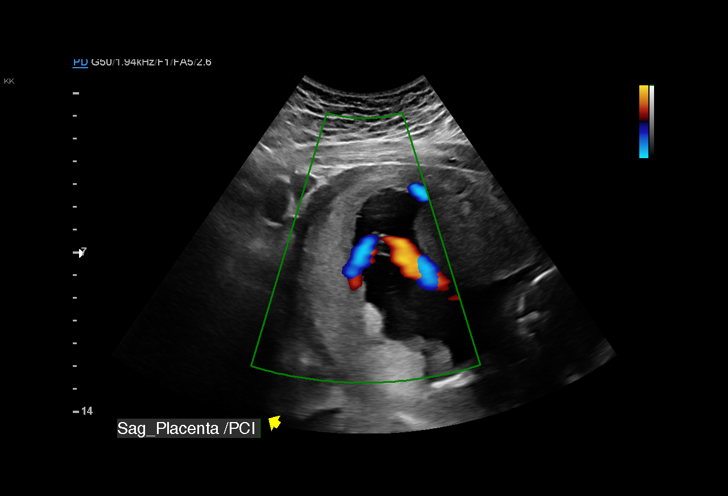
[im 32/122]
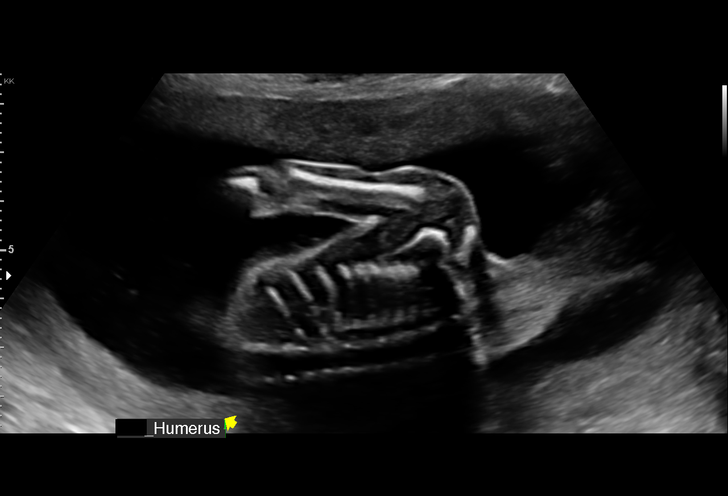
[im 41/122]
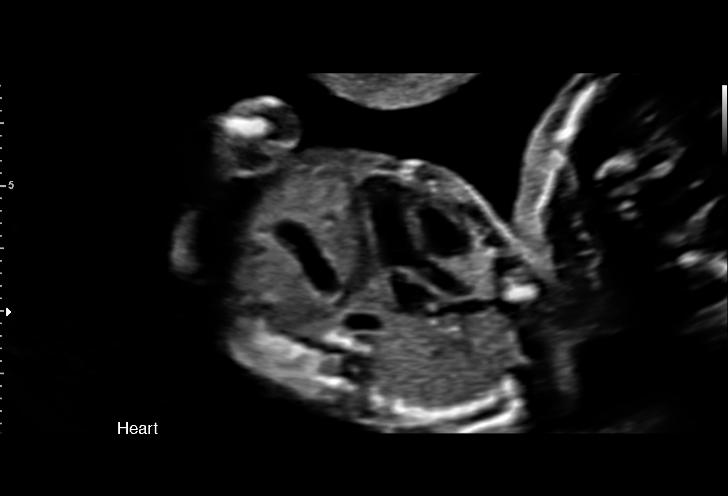
[im 50/122]
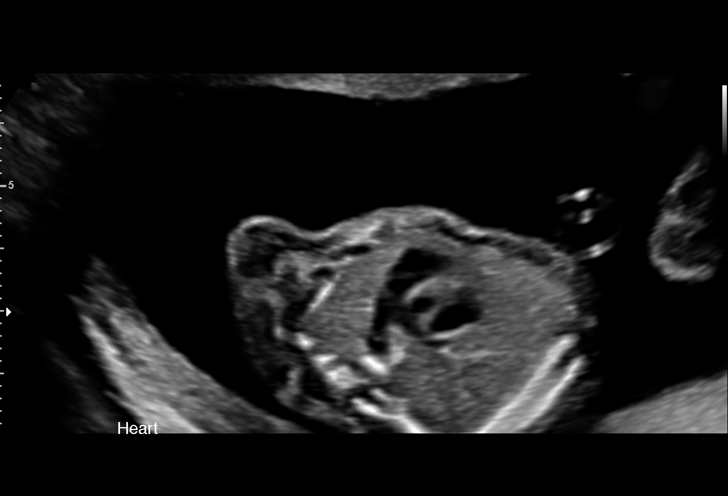
[im 59/122]
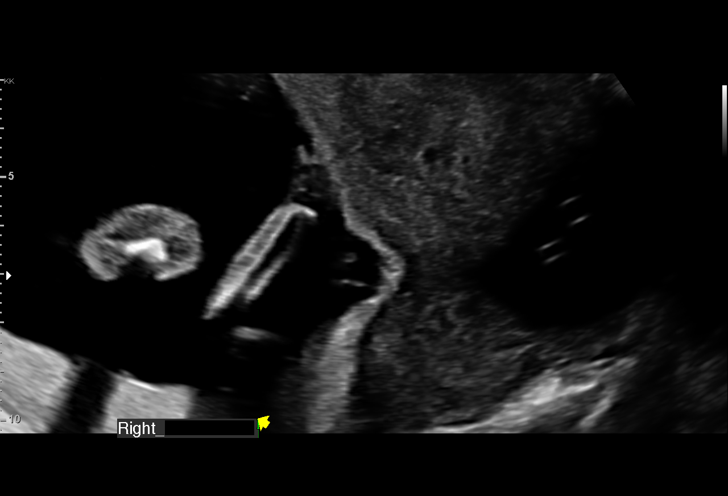
[im 68/122]
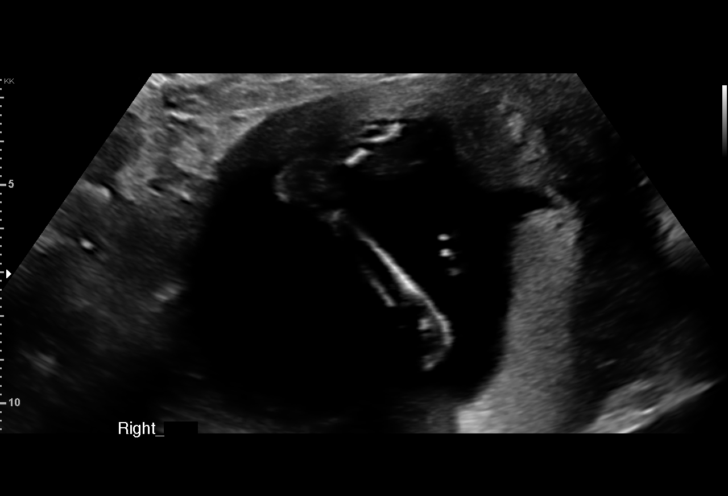
[im 77/122]
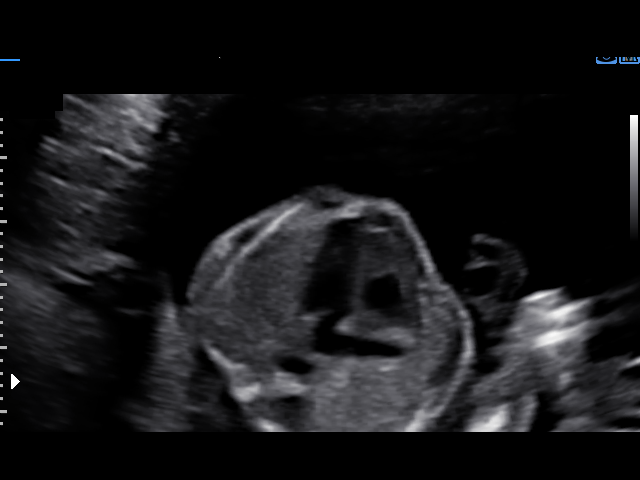
[im 86/122]
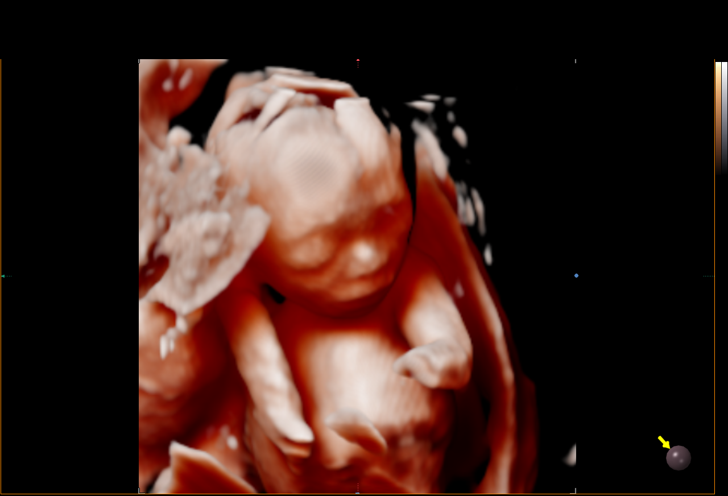
[im 95/122]
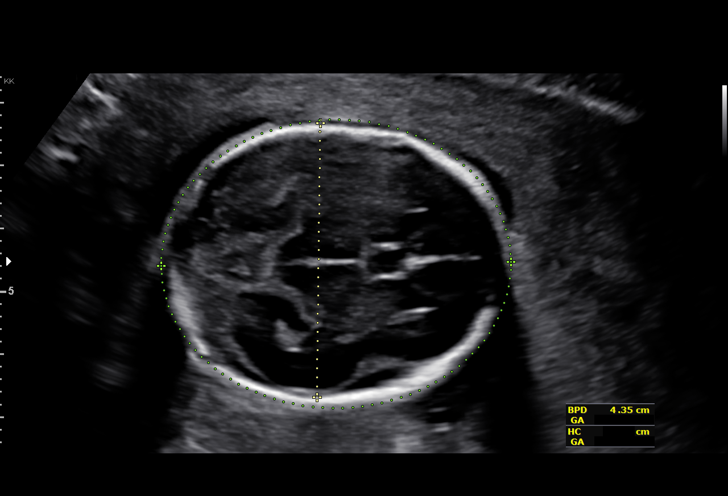
[im 104/122]
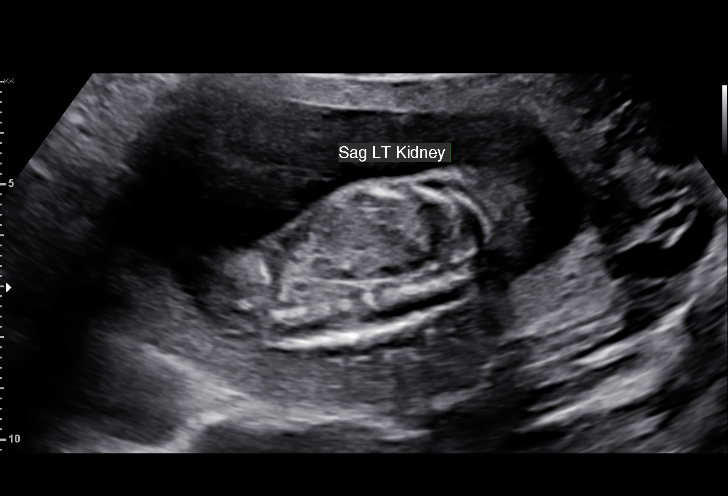
[im 113/122]
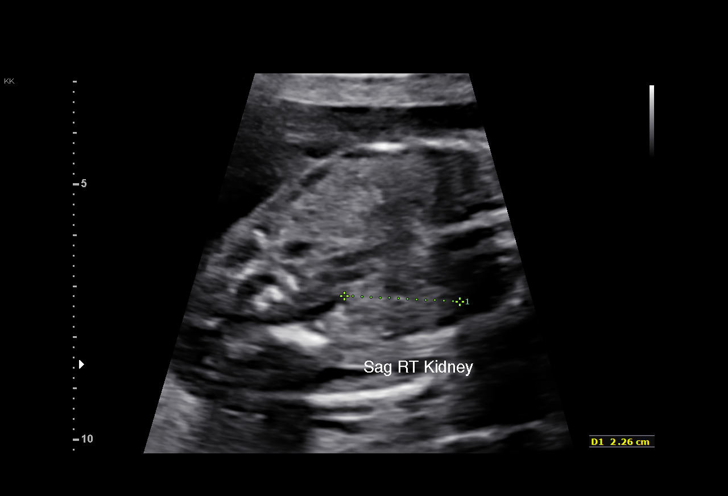
[im 122/122]
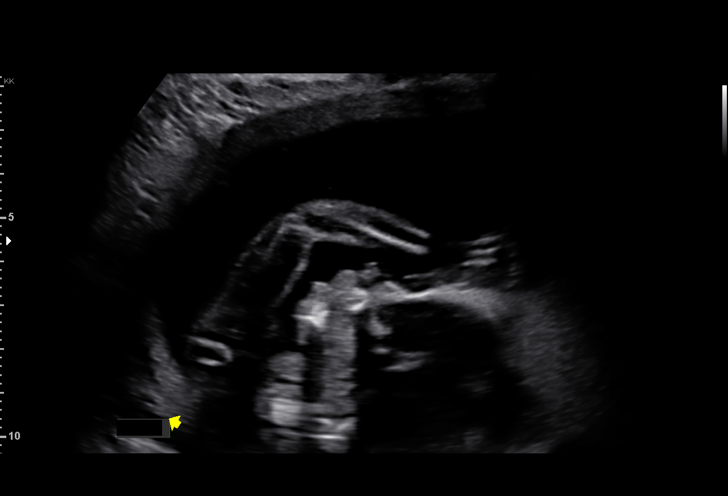

[14 of 28 positions shown; findings below may reference images not displayed]

DETJONI NP

1  ULYSSES KONG           [id number]      [id number]     [id number]
Indications

19 weeks gestation of pregnancy
Encounter for antenatal screening for
malformations
Obesity complicating pregnancy, second
trimester(BMI 32.37)
OB History

Blood Type:            Height:  5'2"   Weight (lb):  184      BMI:
Gravidity:    3         Term:   2        Prem:   0        SAB:   0
TOP:          0       Ectopic:  0        Living: 2
Fetal Evaluation

Num Of Fetuses:     1
Fetal Heart         134
Rate(bpm):
Cardiac Activity:   Observed
Presentation:       Cephalic
Placenta:           Posterior, above cervical os
P. Cord Insertion:  Visualized

Amniotic Fluid
AFI FV:      Subjectively within normal limits

Largest Pocket(cm)
5.0
Biometry

BPD:      43.4  mm     G. Age:  19w 1d         31  %    CI:        73.77   %   70 - 86
FL/HC:      20.1   %   16.8 -
HC:      160.5  mm     G. Age:  18w 6d         15  %    HC/AC:      1.07       1.09 -
AC:      150.6  mm     G. Age:  20w 2d         69  %    FL/BPD:     74.4   %
FL:       32.3  mm     G. Age:  20w 1d         61  %    FL/AC:      21.4   %   20 - 24
HUM:      29.1  mm     G. Age:  19w 3d         50  %
NFT:       3.1  mm

Est. FW:     327  gm    0 lb 12 oz      54  %
Gestational Age

LMP:           19w 4d       Date:   05/31/16                 EDD:   03/07/17
U/S Today:     19w 4d                                        EDD:   03/07/17
Best:          19w 4d    Det. By:   LMP  (05/31/16)          EDD:   03/07/17
Anatomy

Cranium:               Appears normal         Aortic Arch:            Appears normal
Cavum:                 Appears normal         Ductal Arch:            Appears normal
Ventricles:            Appears normal         Diaphragm:              Appears normal
Choroid Plexus:        Appears normal         Stomach:                Appears normal, left
sided
Cerebellum:            Appears normal         Abdomen:                Appears normal
Posterior Fossa:       Appears normal         Abdominal Wall:         Appears nml (cord
insert, abd wall)
Nuchal Fold:           Appears normal         Cord Vessels:           Appears normal (3
vessel cord)
Face:                  Appears normal         Kidneys:                Appear normal
(orbits and profile)
Lips:                  Appears normal         Bladder:                Appears normal
Thoracic:              Appears normal         Spine:                  Not well visualized
Heart:                 Appears normal         Upper Extremities:      Appears normal
(4CH, axis, and situs
RVOT:                  Appears normal         Lower Extremities:      Appears normal
LVOT:                  Appears normal

Other:  Parents do not wish to know sex of fetus. Male gender. Heels and 5th
digit visualized.
Cervix Uterus Adnexa

Cervix
Length:            4.1  cm.
Normal appearance by transabdominal scan.

Left Ovary
Within normal limits.

Right Ovary
Not visualized.

Adnexa:       No visualized
Impression

Single IUP at 19w 4d
Limited views of the fetal spine obtained
The remainder of the fetal anatomy appears normal
Ultrasound measurements are consistent with LMP
Posterior placenta without previa
Normal amniotic fluid volume
Recommendations

Recommend follow-up ultrasound examination in 4-6 weeks
to complete anatomy

## 2019-04-25 IMAGING — US US MFM OB FOLLOW-UP
1 series · 14 of 28 positions shown · non-contrast
Comparison: none

[Series 1: us mfm ob follow-up · 14 of 81 slices shown]
[im 3/81]
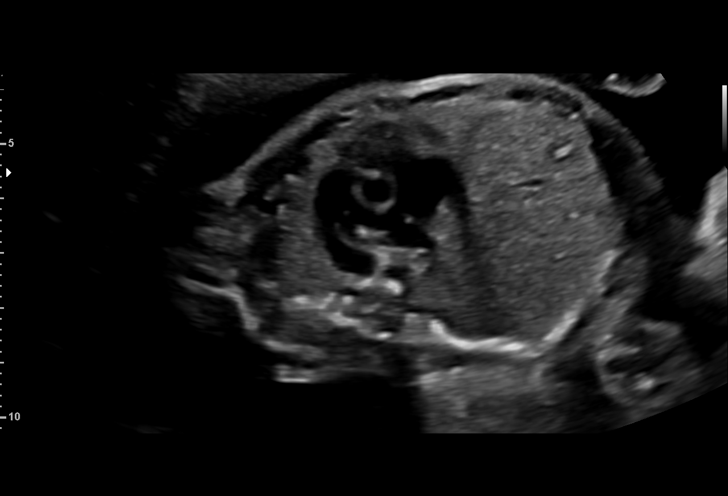
[im 9/81]
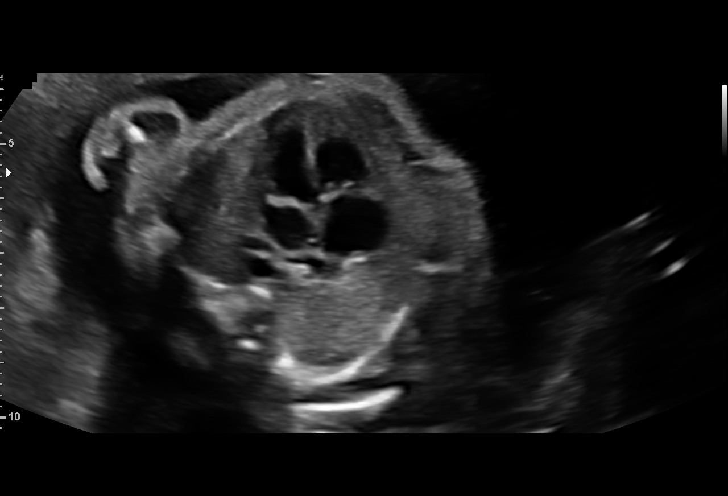
[im 15/81]
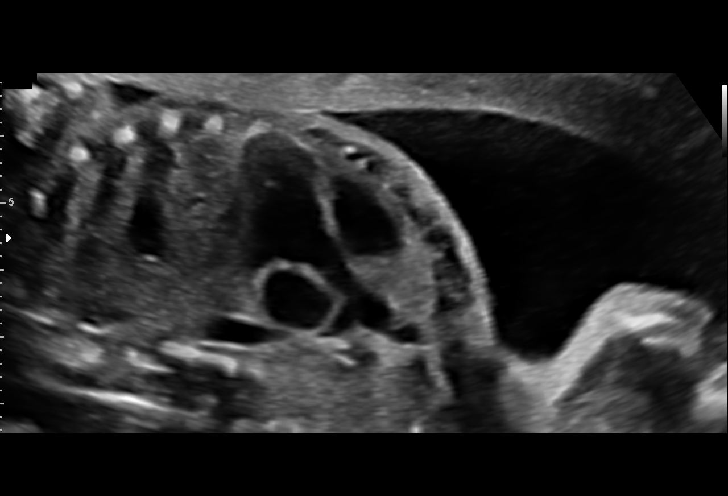
[im 21/81]
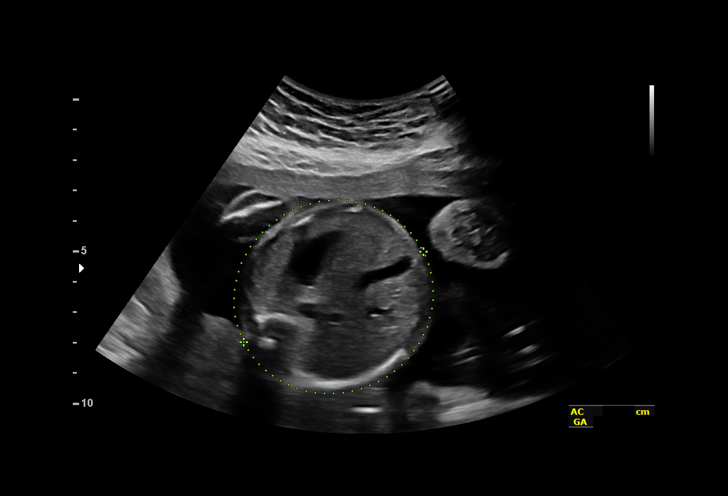
[im 27/81]
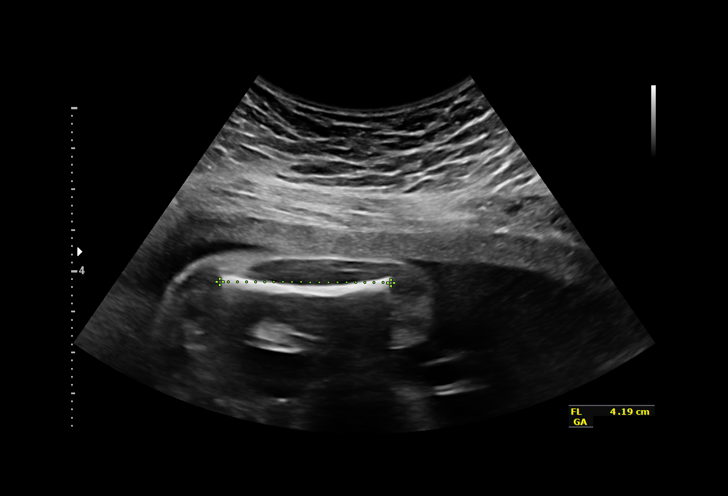
[im 33/81]
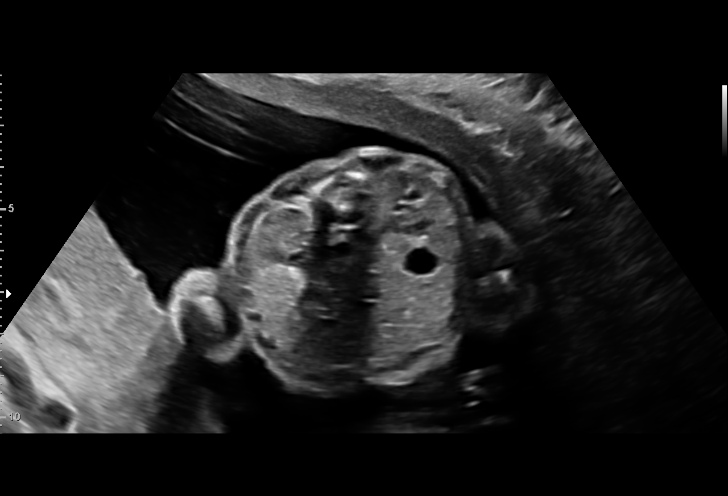
[im 39/81]
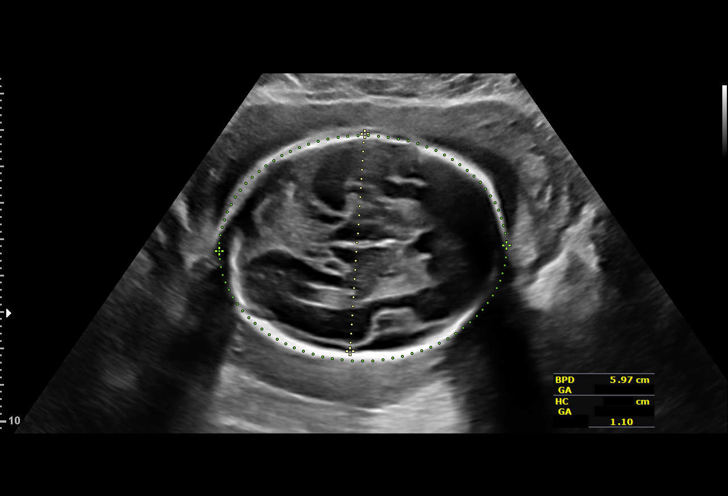
[im 45/81]
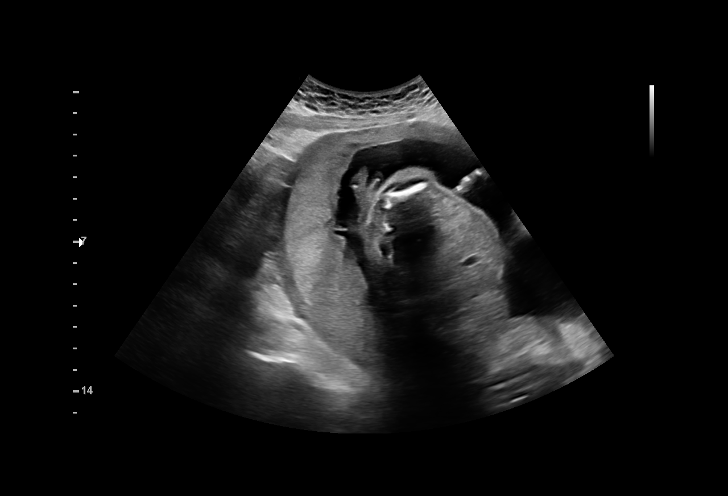
[im 51/81]
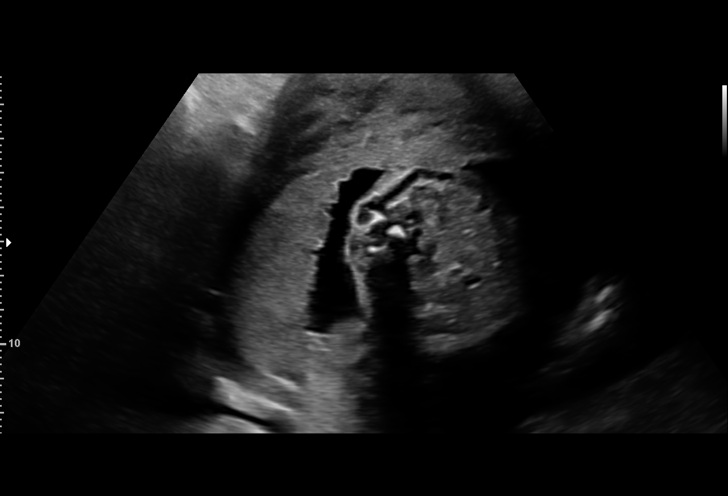
[im 57/81]
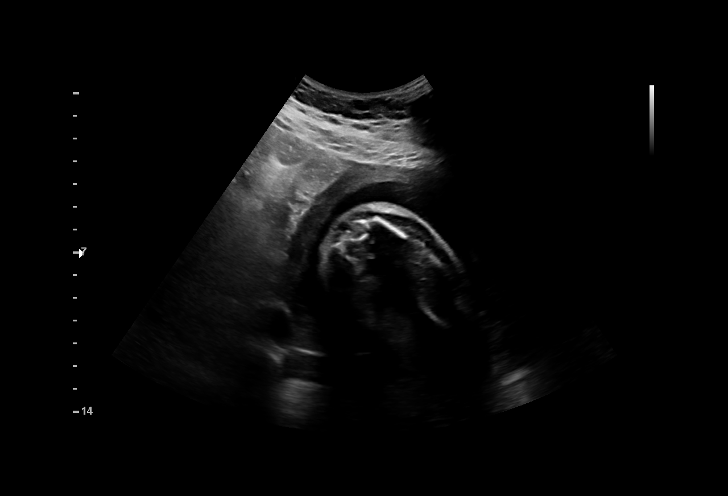
[im 63/81]
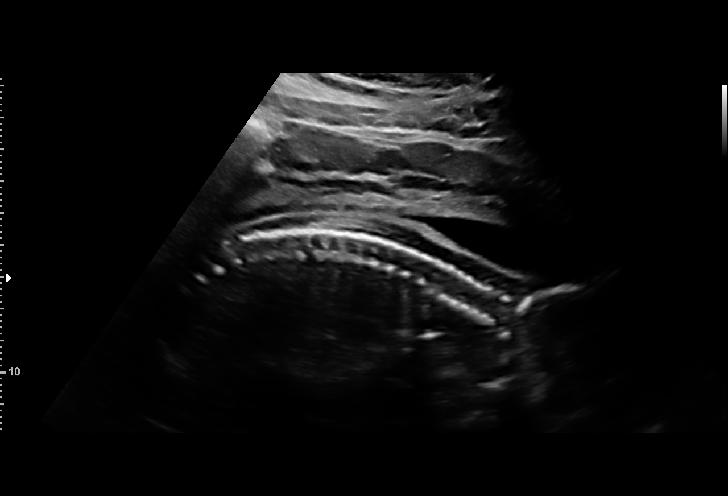
[im 69/81]
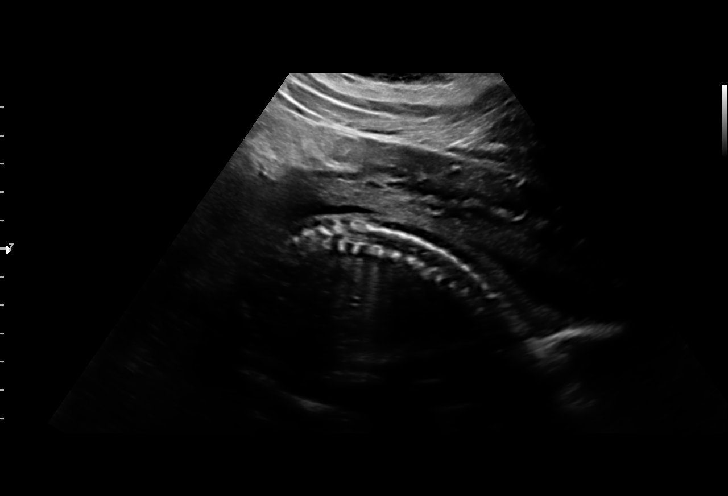
[im 75/81]
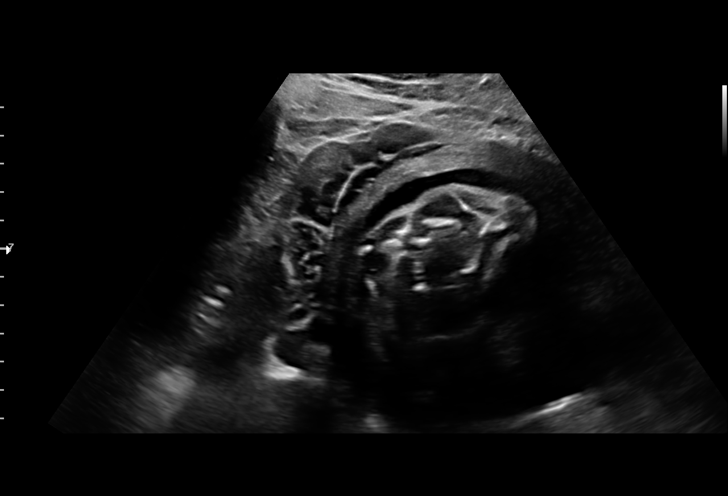
[im 81/81]
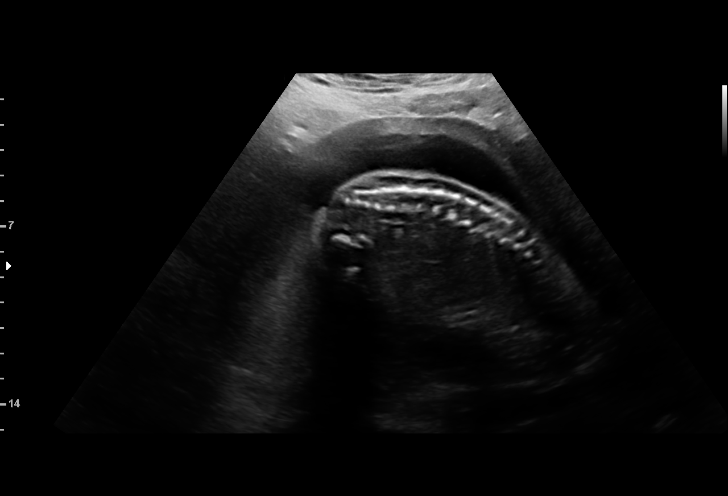

[14 of 28 positions shown; findings below may reference images not displayed]

NYA NP

1  EVANGELISTA FRANDSEN         [id number]      [id number]     [id number]
Indications

24 weeks gestation of pregnancy
Obesity complicating pregnancy, second
trimester(BMI 32.37)
Antenatal follow-up for nonvisualized fetal
anatomy
OB History

Blood Type:            Height:  5'2"   Weight (lb):  184       BMI:
Gravidity:    3         Term:   2        Prem:   0        SAB:   0
TOP:          0       Ectopic:  0        Living: 2
Fetal Evaluation

Num Of Fetuses:     1
Fetal Heart         134
Rate(bpm):
Cardiac Activity:   Observed
Presentation:       Cephalic
Placenta:           Posterior, above cervical os
P. Cord Insertion:  Previously Visualized

Amniotic Fluid
AFI FV:      Subjectively within normal limits

Largest Pocket(cm)
6.08
Biometry

BPD:        60  mm     G. Age:  24w 4d         33  %    CI:        77.21   %    70 - 86
FL/HC:      19.9   %    18.7 -
HC:      216.2  mm     G. Age:  23w 4d          6  %    HC/AC:      1.07        1.04 -
AC:      202.2  mm     G. Age:  24w 6d         45  %    FL/BPD:     71.7   %    71 - 87
FL:         43  mm     G. Age:  24w 1d         20  %    FL/AC:      21.3   %    20 - 24
HUM:      42.3  mm     G. Age:  25w 3d         60  %

Est. FW:     695  gm      1 lb 9 oz     46  %
Gestational Age

LMP:           24w 5d        Date:  05/31/16                 EDD:   03/07/17
U/S Today:     24w 2d                                        EDD:   03/10/17
Best:          24w 5d     Det. By:  LMP  (05/31/16)          EDD:   03/07/17
Anatomy

Cranium:               Appears normal         Aortic Arch:            Previously seen
Cavum:                 Appears normal         Ductal Arch:            Previously seen
Ventricles:            Appears normal         Diaphragm:              Previously seen
Choroid Plexus:        Previously seen        Stomach:                Appears normal, left
sided
Cerebellum:            Previously seen        Abdomen:                Previously seen
Posterior Fossa:       Previously seen        Abdominal Wall:         Previously seen
Nuchal Fold:           Previously seen        Cord Vessels:           Previously seen
Face:                  Orbits and profile     Kidneys:                Appear normal
previously seen
Lips:                  Previously seen        Bladder:                Appears normal
Thoracic:              Appears normal         Spine:                  Limited views
appear normal
Heart:                 Appears normal         Upper Extremities:      Previously seen
(4CH, axis, and situs
RVOT:                  Appears normal         Lower Extremities:      Previously seen
LVOT:                  Appears normal

Other:  Fetus appears to be a male. Heels and 5th digit prev visualized.
Technically difficult due to fetal position.
Cervix Uterus Adnexa

Cervix
Length:           3.72  cm.
Normal appearance by transabdominal scan.

Uterus
No abnormality visualized.

Left Ovary
Previously seen.

Right Ovary
Not visualized.
Impression

Singleton intrauterine pregnancy at 24+5 weeks, here to
complete anatomic survey
Interval review of the anatomy shows no sonographic
markers for aneuploidy or structural anomalies
All relevant anatomy has been visualized
Amniotic fluid volume is normal
Growth is in the 46th percentile
Recommendations

Follow-up ultrasounds as clinically indicated.

## 2019-11-05 ENCOUNTER — Encounter (HOSPITAL_COMMUNITY): Payer: Self-pay

## 2019-11-05 ENCOUNTER — Ambulatory Visit (HOSPITAL_COMMUNITY)
Admission: EM | Admit: 2019-11-05 | Discharge: 2019-11-05 | Disposition: A | Discharge status: Home / Self Care | Payer: Medicaid Other | Attending: Physician Assistant | Admitting: Physician Assistant

## 2019-11-05 ENCOUNTER — Other Ambulatory Visit: Payer: Self-pay

## 2019-11-05 DIAGNOSIS — Z79899 Other long term (current) drug therapy: Secondary | ICD-10-CM | POA: Diagnosis not present

## 2019-11-05 DIAGNOSIS — Z880 Allergy status to penicillin: Secondary | ICD-10-CM | POA: Insufficient documentation

## 2019-11-05 DIAGNOSIS — Z87891 Personal history of nicotine dependence: Secondary | ICD-10-CM | POA: Insufficient documentation

## 2019-11-05 DIAGNOSIS — M419 Scoliosis, unspecified: Secondary | ICD-10-CM | POA: Insufficient documentation

## 2019-11-05 DIAGNOSIS — K219 Gastro-esophageal reflux disease without esophagitis: Secondary | ICD-10-CM | POA: Insufficient documentation

## 2019-11-05 DIAGNOSIS — Z791 Long term (current) use of non-steroidal anti-inflammatories (NSAID): Secondary | ICD-10-CM | POA: Diagnosis not present

## 2019-11-05 DIAGNOSIS — J069 Acute upper respiratory infection, unspecified: Secondary | ICD-10-CM | POA: Diagnosis present

## 2019-11-05 DIAGNOSIS — Z20822 Contact with and (suspected) exposure to covid-19: Secondary | ICD-10-CM | POA: Insufficient documentation

## 2019-11-05 LAB — SARS CORONAVIRUS 2 (TAT 6-24 HRS): SARS Coronavirus 2: NEGATIVE

## 2019-11-05 NOTE — Discharge Instructions (Signed)
Your covid test will return in approx 24 hours.

## 2019-11-05 NOTE — ED Triage Notes (Signed)
Pt presents with congestion, chills, fatigue, nausea, loss of taste and chills for over a week.

## 2019-11-05 NOTE — ED Provider Notes (Signed)
[facility]    CSN: [id number] Arrival date & time: 11/05/19  0850      History   Chief Complaint Chief Complaint  Patient presents with  . URI    HPI Katherine Hooper is a 29 y.o. female.   The history is provided by the patient. No language interpreter was used.  URI Presenting symptoms: cough and fever   Severity:  Moderate Onset quality:  Gradual Timing:  Constant Progression:  Worsening Chronicity:  New Relieved by:  Nothing Worsened by:  Nothing Ineffective treatments:  None tried Risk factors: no recent illness and no sick contacts   Pt complains of having all the covid symptoms.   Past Medical History:  Diagnosis Date  . Abnormal Pap smear   . Anemia    during last pregnancy  . Chlamydia   . GERD (gastroesophageal reflux disease)   . Group B streptococcal infection in pregnancy 2009  . HPV (human papilloma virus) anogenital infection   . Infection    BV x 2;currently on Flagyl for BV  . Scoliosis   . Scoliosis     Patient Active Problem List   Diagnosis Date Noted  . Normal labor and delivery 02/26/2017  . Nausea and vomiting in pregnancy prior to [redacted] weeks gestation 09/25/2016  . Supervision of other normal pregnancy, antepartum 08/30/2016  . GERD (gastroesophageal reflux disease) 02/13/2012  . Allergy to penicillin 01/09/2012    Past Surgical History:  Procedure Laterality Date  . NO PAST SURGERIES      OB History    Gravida  3   Para  3   Term  3   Preterm  0   AB  0   Living  3     SAB  0   TAB  0   Ectopic  0   Multiple  0   Live Births  3            Home Medications    Prior to Admission medications   Medication Sig Start Date End Date Taking? Authorizing Provider  dicyclomine (BENTYL) 20 MG tablet Take 1 tablet (20 mg total) by mouth 2 (two) times daily. 04/06/18   Khatri, Hina, PA-C  doxycycline (VIBRAMYCIN) 100 MG capsule Take 1 capsule (100 mg total) by mouth 2 (two) times daily. 02/24/18    Roxy Horseman, PA-C  famotidine (PEPCID) 20 MG tablet Take 1 tablet (20 mg total) by mouth 2 (two) times daily. 04/06/18   Khatri, Hina, PA-C  ibuprofen (ADVIL,MOTRIN) 600 MG tablet Take 1 tablet (600 mg total) by mouth every 6 (six) hours. 02/28/17   Pincus Large, DO  Olopatadine HCl 0.2 % SOLN Apply 1 drop to eye daily. 02/12/18   Mardella Layman, MD  ondansetron (ZOFRAN ODT) 4 MG disintegrating tablet Take 1 tablet (4 mg total) by mouth every 8 (eight) hours as needed for nausea or vomiting. 04/06/18   Dietrich Pates, PA-C    Family History Family History  Problem Relation Age of Onset  . Hypertension Maternal Grandmother   . Diabetes Maternal Grandmother   . Asthma Maternal Grandmother   . Kidney disease Maternal Grandmother        Dialysis  . Hypertension Maternal Grandfather   . Diabetes Maternal Grandfather   . Stroke Maternal Grandfather   . Hypertension Paternal Grandmother   . Hypertension Paternal Grandfather   . Asthma Cousin        Maternal  . Other Neg Hx     Social  History Social History   Tobacco Use  . Smoking status: Former Smoker    Packs/day: 0.25    Years: 10.00    Pack years: 2.50    Types: Cigarettes    Quit date: 09/17/2011    Years since quitting: 8.1  . Smokeless tobacco: Never Used  Substance Use Topics  . Alcohol use: No  . Drug use: No     Allergies   Penicillins   Review of Systems Review of Systems  Constitutional: Positive for fever.  Respiratory: Positive for cough.   All other systems reviewed and are negative.    Physical Exam Triage Vital Signs ED Triage Vitals  Enc Vitals Group     BP 11/05/19 1056 115/77     Pulse Rate 11/05/19 1056 89     Resp 11/05/19 1056 17     Temp 11/05/19 1056 98.3 F (36.8 C)     Temp Source 11/05/19 1056 Oral     SpO2 11/05/19 1056 98 %     Weight --      Height --      Head Circumference --      Peak Flow --      Pain Score 11/05/19 1102 4     Pain Loc --      Pain Edu? --       Excl. in GC? --    No data found.  Updated Vital Signs BP 115/77 (BP Location: Right Arm)   Pulse 89   Temp 98.3 F (36.8 C) (Oral)   Resp 17   SpO2 98%   Visual Acuity Right Eye Distance:   Left Eye Distance:   Bilateral Distance:    Right Eye Near:   Left Eye Near:    Bilateral Near:     Physical Exam Vitals and nursing note reviewed.  Constitutional:      Appearance: She is well-developed.  HENT:     Head: Normocephalic.  Cardiovascular:     Rate and Rhythm: Normal rate and regular rhythm.  Pulmonary:     Effort: Pulmonary effort is normal.  Abdominal:     General: There is no distension.  Musculoskeletal:        General: Normal range of motion.     Cervical back: Normal range of motion.  Skin:    General: Skin is warm.  Neurological:     General: No focal deficit present.     Mental Status: She is alert and oriented to person, place, and time.  Psychiatric:        Mood and Affect: Mood normal.      UC Treatments / Results  Labs (all labs ordered are listed, but only abnormal results are displayed) Labs Reviewed  SARS CORONAVIRUS 2 (TAT 6-24 HRS)    EKG   Radiology No results found.  Procedures Procedures (including critical care time)  Medications Ordered in UC Medications - No data to display  Initial Impression / Assessment and Plan / UC Course  I have reviewed the triage vital signs and the nursing notes.  Pertinent labs & imaging results that were available during my care of the patient were reviewed by me and considered in my medical decision making (see chart for details).    MDM  I suspect covid.  Test pending.  Pt advised to quarantine   Final Clinical Impressions(s) / UC Diagnoses   Final diagnoses:  Viral URI with cough     Discharge Instructions     Your covid test  will return in approx 24 hours.    ED Prescriptions    None    An After Visit Summary was printed and given to the patient.  PDMP not reviewed this  encounter.   Elson Areas, [place] 11/05/19 1352

## 2020-04-29 DIAGNOSIS — Z0389 Encounter for observation for other suspected diseases and conditions ruled out: Secondary | ICD-10-CM | POA: Diagnosis not present

## 2020-04-29 DIAGNOSIS — Z1388 Encounter for screening for disorder due to exposure to contaminants: Secondary | ICD-10-CM | POA: Diagnosis not present

## 2020-04-29 DIAGNOSIS — Z3009 Encounter for other general counseling and advice on contraception: Secondary | ICD-10-CM | POA: Diagnosis not present

## 2020-06-13 DIAGNOSIS — Z3009 Encounter for other general counseling and advice on contraception: Secondary | ICD-10-CM | POA: Diagnosis not present

## 2020-06-13 DIAGNOSIS — Z1388 Encounter for screening for disorder due to exposure to contaminants: Secondary | ICD-10-CM | POA: Diagnosis not present

## 2020-06-13 DIAGNOSIS — Z0389 Encounter for observation for other suspected diseases and conditions ruled out: Secondary | ICD-10-CM | POA: Diagnosis not present

## 2021-01-09 DIAGNOSIS — A64 Unspecified sexually transmitted disease: Secondary | ICD-10-CM | POA: Diagnosis not present

## 2021-01-09 DIAGNOSIS — B9689 Other specified bacterial agents as the cause of diseases classified elsewhere: Secondary | ICD-10-CM | POA: Diagnosis not present

## 2021-01-09 DIAGNOSIS — Z124 Encounter for screening for malignant neoplasm of cervix: Secondary | ICD-10-CM | POA: Diagnosis not present

## 2021-01-09 DIAGNOSIS — Z113 Encounter for screening for infections with a predominantly sexual mode of transmission: Secondary | ICD-10-CM | POA: Diagnosis not present

## 2021-01-09 DIAGNOSIS — N760 Acute vaginitis: Secondary | ICD-10-CM | POA: Diagnosis not present

## 2021-09-11 DIAGNOSIS — Z975 Presence of (intrauterine) contraceptive device: Secondary | ICD-10-CM | POA: Insufficient documentation

## 2021-09-11 DIAGNOSIS — Z6839 Body mass index (BMI) 39.0-39.9, adult: Secondary | ICD-10-CM | POA: Insufficient documentation

## 2021-09-11 DIAGNOSIS — E66811 Obesity, class 1: Secondary | ICD-10-CM | POA: Insufficient documentation

## 2022-10-22 DIAGNOSIS — Z975 Presence of (intrauterine) contraceptive device: Secondary | ICD-10-CM | POA: Diagnosis not present

## 2022-10-22 DIAGNOSIS — N898 Other specified noninflammatory disorders of vagina: Secondary | ICD-10-CM | POA: Diagnosis not present

## 2022-10-22 DIAGNOSIS — Z6838 Body mass index (BMI) 38.0-38.9, adult: Secondary | ICD-10-CM | POA: Diagnosis not present

## 2022-10-22 DIAGNOSIS — Z713 Dietary counseling and surveillance: Secondary | ICD-10-CM | POA: Diagnosis not present

## 2022-10-22 DIAGNOSIS — Z113 Encounter for screening for infections with a predominantly sexual mode of transmission: Secondary | ICD-10-CM | POA: Diagnosis not present

## 2022-12-03 DIAGNOSIS — N898 Other specified noninflammatory disorders of vagina: Secondary | ICD-10-CM | POA: Diagnosis not present

## 2022-12-03 DIAGNOSIS — Z975 Presence of (intrauterine) contraceptive device: Secondary | ICD-10-CM | POA: Diagnosis not present

## 2022-12-03 DIAGNOSIS — Z2821 Immunization not carried out because of patient refusal: Secondary | ICD-10-CM | POA: Diagnosis not present

## 2022-12-03 DIAGNOSIS — Z1239 Encounter for other screening for malignant neoplasm of breast: Secondary | ICD-10-CM | POA: Diagnosis not present

## 2022-12-03 DIAGNOSIS — Z0000 Encounter for general adult medical examination without abnormal findings: Secondary | ICD-10-CM | POA: Diagnosis not present

## 2022-12-03 DIAGNOSIS — Z6839 Body mass index (BMI) 39.0-39.9, adult: Secondary | ICD-10-CM | POA: Diagnosis not present

## 2022-12-10 DIAGNOSIS — E559 Vitamin D deficiency, unspecified: Secondary | ICD-10-CM | POA: Diagnosis not present

## 2022-12-10 DIAGNOSIS — N914 Secondary oligomenorrhea: Secondary | ICD-10-CM | POA: Diagnosis not present

## 2022-12-10 DIAGNOSIS — R635 Abnormal weight gain: Secondary | ICD-10-CM | POA: Diagnosis not present

## 2022-12-10 DIAGNOSIS — Z1331 Encounter for screening for depression: Secondary | ICD-10-CM | POA: Diagnosis not present

## 2022-12-10 DIAGNOSIS — E88819 Insulin resistance, unspecified: Secondary | ICD-10-CM | POA: Diagnosis not present

## 2022-12-10 DIAGNOSIS — Z79899 Other long term (current) drug therapy: Secondary | ICD-10-CM | POA: Diagnosis not present

## 2022-12-10 DIAGNOSIS — R0602 Shortness of breath: Secondary | ICD-10-CM | POA: Diagnosis not present

## 2022-12-30 ENCOUNTER — Ambulatory Visit
Admission: EM | Admit: 2022-12-30 | Discharge: 2022-12-30 | Disposition: A | Discharge status: Home / Self Care | Payer: Medicaid Other

## 2022-12-30 ENCOUNTER — Emergency Department (HOSPITAL_COMMUNITY): Payer: Medicaid Other

## 2022-12-30 ENCOUNTER — Other Ambulatory Visit: Payer: Self-pay

## 2022-12-30 ENCOUNTER — Emergency Department (HOSPITAL_COMMUNITY)
Admission: EM | Admit: 2022-12-30 | Discharge: 2022-12-30 | Disposition: A | Discharge status: Home / Self Care | Payer: Medicaid Other | Attending: Emergency Medicine | Admitting: Emergency Medicine

## 2022-12-30 ENCOUNTER — Encounter (HOSPITAL_COMMUNITY): Payer: Self-pay | Admitting: Pharmacy Technician

## 2022-12-30 DIAGNOSIS — M79662 Pain in left lower leg: Secondary | ICD-10-CM

## 2022-12-30 DIAGNOSIS — M79605 Pain in left leg: Secondary | ICD-10-CM

## 2022-12-30 MED ORDER — CYCLOBENZAPRINE HCL 10 MG PO TABS: 10.0000 mg | ORAL_TABLET | Freq: Two times a day (BID) | ORAL | 0 refills | Status: AC | PRN

## 2022-12-30 NOTE — ED Provider Notes (Signed)
Here today for evaluation of injury to her left lower calf.  She states that she went to lean over the bed with her feet planted and felt a pop in her left calf.  She has had pain with ambulation since then.  Recommended further evaluation emergency room as it sounds as if she has a possible tendon issue.  Unable to fully determine this in urgent care setting.  Patient is agreeable and will transport via POV.   Tomi Bamberger, PA-C 12/30/22 1526

## 2022-12-30 NOTE — ED Triage Notes (Signed)
"  This morning I was getting dressed, my nephew had fell back on my bed, when I got to the bed to grab him I went to bend over bed with feet flat to grab him extending muscles and heard a pop in the back of my left lower calf muscle". "I can only walk now in a certain way".

## 2022-12-30 NOTE — Discharge Instructions (Addendum)
It was a pleasure taking part in your care today.  Please follow-up with Dr. Ave Filter of [facility] orthopedics for further care.  You may also elect to present to the orthopedic urgent care at [facility].  Please take ibuprofen every 6 hours as needed for pain and calf.  Please also take muscle relaxer twice a day as needed.  Please also employ RICE therapy which stands for rest, ice, compression and elevation.  Please return to the ED with any new or worsening symptoms.

## 2022-12-30 NOTE — ED Provider Notes (Signed)
[place] EMERGENCY DEPARTMENT AT [facility] Provider Note   CSN: [id number] Arrival date & time: 12/30/22  1641     History  Chief Complaint  Patient presents with   Leg Pain    Katherine Hooper is a 32 y.o. female with medical history of scoliosis, HPV, GERD.  Patient presents to ED for evaluation of left calf pain.  Patient states that earlier this morning she was bending over to catch her nephew from falling off the bed.  She reports that she was standing at this time and bending over the bed when she felt immediate "pop" in her left calf.  She states that since this time she has had significant pain to her posterior left calf.  She states that she cannot even bear weight on her left calf.  She denies chest pain, shortness of breath, history of DVTs.  She states that she went to urgent care and they redirected her to the ED for further management and care.  I am unsure why they did this.   Leg Pain      Home Medications Prior to Admission medications   Medication Sig Start Date End Date Taking? Authorizing Provider  cyclobenzaprine (FLEXERIL) 10 MG tablet Take 1 tablet (10 mg total) by mouth 2 (two) times daily as needed for muscle spasms. 12/30/22  Yes Al Decant, PA-C  dicyclomine (BENTYL) 20 MG tablet Take 1 tablet (20 mg total) by mouth 2 (two) times daily. 04/06/18   Khatri, Hina, PA-C  doxycycline (VIBRAMYCIN) 100 MG capsule Take 1 capsule (100 mg total) by mouth 2 (two) times daily. 02/24/18   Roxy Horseman, PA-C  famotidine (PEPCID) 20 MG tablet Take 1 tablet (20 mg total) by mouth 2 (two) times daily. 04/06/18   Khatri, Hina, PA-C  fluconazole (DIFLUCAN) 200 MG tablet Take 200 mg by mouth once. 11/23/22   [provider]  ibuprofen (ADVIL,MOTRIN) 600 MG tablet Take 1 tablet (600 mg total) by mouth every 6 (six) hours. 02/28/17   Pincus Large, DO  levonorgestrel (MIRENA, 52 MG,) 20 MCG/DAY IUD 1 each by Intrauterine route once.     [provider]  metroNIDAZOLE (FLAGYL) 500 MG tablet Take 500 mg by mouth 2 (two) times daily. 11/23/22   [provider]  metroNIDAZOLE (METROGEL) 0.75 % gel Apply 1 Application topically 2 (two) times daily. 11/23/22   [provider]  Olopatadine HCl 0.2 % SOLN Apply 1 drop to eye daily. 02/12/18   Mardella Layman, MD  ondansetron (ZOFRAN ODT) 4 MG disintegrating tablet Take 1 tablet (4 mg total) by mouth every 8 (eight) hours as needed for nausea or vomiting. 04/06/18   Khatri, Hina, PA-C  phentermine 15 MG capsule Take 15 mg by mouth daily. 12/10/22   [provider]      Allergies    Metronidazole and Penicillins    Review of Systems   Review of Systems  Musculoskeletal:  Positive for myalgias.  All other systems reviewed and are negative.   Physical Exam Updated Vital Signs BP 126/82 (BP Location: Left Arm)   Pulse (!) 115   Temp 98.8 F (37.1 C) (Oral)   Resp 18   LMP 12/24/2022 (Approximate)   SpO2 100%  Physical Exam Vitals and nursing note reviewed.  Constitutional:      General: She is not in acute distress.    Appearance: Normal appearance. She is not ill-appearing, toxic-appearing or diaphoretic.  HENT:     Head: Normocephalic and atraumatic.  Nose: Nose normal.     Mouth/Throat:     Mouth: Mucous membranes are moist.     Pharynx: Oropharynx is clear.  Eyes:     Extraocular Movements: Extraocular movements intact.     Conjunctiva/sclera: Conjunctivae normal.     Pupils: Pupils are equal, round, and reactive to light.  Cardiovascular:     Rate and Rhythm: Normal rate and regular rhythm.  Pulmonary:     Effort: Pulmonary effort is normal.     Breath sounds: Normal breath sounds. No wheezing.  Abdominal:     General: Abdomen is flat. Bowel sounds are normal.     Palpations: Abdomen is soft.     Tenderness: There is no abdominal tenderness.  Musculoskeletal:        General: Tenderness present.     Cervical back: Normal  range of motion and neck supple. No tenderness.     Comments: Patient does have tenderness to palpation of her left calf.  Negative Homans' sign.  Skin:    General: Skin is warm and dry.     Capillary Refill: Capillary refill takes less than 2 seconds.  Neurological:     Mental Status: She is alert and oriented to person, place, and time.     ED Results / Procedures / Treatments   Labs (all labs ordered are listed, but only abnormal results are displayed) Labs Reviewed - No data to display  EKG None  Radiology No results found.  Procedures Procedures   Medications Ordered in ED Medications - No data to display  ED Course/ Medical Decision Making/ A&P  Medical Decision Making Amount and/or Complexity of Data Reviewed Radiology: ordered.   32 year old female presents to ED for evaluation.  Please see HPI for further details.  On examination patient is afebrile, initially tachycardic in triage but pulse rate is normalized.  Lung sounds clear bilaterally, she is not hypoxic.  Her abdomen is soft and compressible throughout.  Neurological examination at baseline.  She does have tenderness to her left calf without overlying skin change.  2+ DP pulse in the left foot.  Full range of motion of left knee, left ankle.  Plain film imaging of patient left tibia/fibula unremarkable.  Considered ultrasound imaging but patient reports that this began immediately upon bending over, hyperextending her left knee and hearing a pop.  I feel this is unlikely.  She denies a history of DVTs, recent surgery or travel, hemoptysis, unilateral leg swelling, chest pain or shortness of breath.  At this time the patient be referred to orthopedics.  She will be sent home with muscle relaxer and advised to employ RICE therapy.  Will also be encouraged to take NSAIDs every 6 hours.  Advised to return to the ED with any new or worsening signs or symptoms.  Patient already has crutches which she was given at  urgent care.  Patient stable to discharge.   Final Clinical Impression(s) / ED Diagnoses Final diagnoses:  Left leg pain    Rx / DC Orders ED Discharge Orders          Ordered    cyclobenzaprine (FLEXERIL) 10 MG tablet  2 times daily PRN        12/30/22 1921              Clent Ridges 12/30/22 Jarome Matin, MD 01/02/23 [phone]

## 2022-12-30 NOTE — ED Notes (Signed)
Patient is being discharged from the Urgent Care and sent to the Emergency Department via Private Vehicle (Another adult-sister) . Per R. Reggie Pile, patient is in need of higher level of care due to evaluation of injury (? Tendon tear/injury). Patient is aware and verbalizes understanding of plan of care.  Vitals:   12/30/22 1353  BP: 116/79  Pulse: 96  Resp: 20  Temp: 98.6 F (37 C)  SpO2: 97%

## 2022-12-30 NOTE — ED Triage Notes (Signed)
Pt sent here from [facility] for further work up of L calf pain. States when she bent over she heard a pop and now decreased ROM due to pain.

## 2022-12-31 DIAGNOSIS — M79662 Pain in left lower leg: Secondary | ICD-10-CM | POA: Diagnosis not present

## 2023-01-07 DIAGNOSIS — S86112A Strain of other muscle(s) and tendon(s) of posterior muscle group at lower leg level, left leg, initial encounter: Secondary | ICD-10-CM | POA: Diagnosis not present

## 2023-01-07 DIAGNOSIS — M79662 Pain in left lower leg: Secondary | ICD-10-CM | POA: Diagnosis not present

## 2023-01-14 DIAGNOSIS — Z79899 Other long term (current) drug therapy: Secondary | ICD-10-CM | POA: Diagnosis not present

## 2023-01-14 DIAGNOSIS — Z6836 Body mass index (BMI) 36.0-36.9, adult: Secondary | ICD-10-CM | POA: Diagnosis not present

## 2023-01-14 DIAGNOSIS — E6609 Other obesity due to excess calories: Secondary | ICD-10-CM | POA: Diagnosis not present

## 2023-01-14 DIAGNOSIS — E559 Vitamin D deficiency, unspecified: Secondary | ICD-10-CM | POA: Diagnosis not present

## 2023-01-14 DIAGNOSIS — N914 Secondary oligomenorrhea: Secondary | ICD-10-CM | POA: Diagnosis not present

## 2023-01-21 DIAGNOSIS — B070 Plantar wart: Secondary | ICD-10-CM | POA: Diagnosis not present

## 2023-01-21 DIAGNOSIS — N898 Other specified noninflammatory disorders of vagina: Secondary | ICD-10-CM | POA: Diagnosis not present

## 2023-01-21 DIAGNOSIS — L309 Dermatitis, unspecified: Secondary | ICD-10-CM | POA: Diagnosis not present

## 2023-01-27 DIAGNOSIS — S86819A Strain of other muscle(s) and tendon(s) at lower leg level, unspecified leg, initial encounter: Secondary | ICD-10-CM | POA: Insufficient documentation

## 2023-02-11 DIAGNOSIS — E559 Vitamin D deficiency, unspecified: Secondary | ICD-10-CM | POA: Diagnosis not present

## 2023-02-11 DIAGNOSIS — E6609 Other obesity due to excess calories: Secondary | ICD-10-CM | POA: Diagnosis not present

## 2023-02-11 DIAGNOSIS — Z6837 Body mass index (BMI) 37.0-37.9, adult: Secondary | ICD-10-CM | POA: Diagnosis not present

## 2023-02-11 DIAGNOSIS — R030 Elevated blood-pressure reading, without diagnosis of hypertension: Secondary | ICD-10-CM | POA: Diagnosis not present

## 2023-02-11 DIAGNOSIS — N914 Secondary oligomenorrhea: Secondary | ICD-10-CM | POA: Diagnosis not present

## 2023-03-25 DIAGNOSIS — N914 Secondary oligomenorrhea: Secondary | ICD-10-CM | POA: Diagnosis not present

## 2023-03-25 DIAGNOSIS — Z6837 Body mass index (BMI) 37.0-37.9, adult: Secondary | ICD-10-CM | POA: Diagnosis not present

## 2023-03-25 DIAGNOSIS — Z6836 Body mass index (BMI) 36.0-36.9, adult: Secondary | ICD-10-CM | POA: Diagnosis not present

## 2023-03-25 DIAGNOSIS — R030 Elevated blood-pressure reading, without diagnosis of hypertension: Secondary | ICD-10-CM | POA: Diagnosis not present

## 2023-03-25 DIAGNOSIS — E6609 Other obesity due to excess calories: Secondary | ICD-10-CM | POA: Diagnosis not present

## 2023-03-25 DIAGNOSIS — E559 Vitamin D deficiency, unspecified: Secondary | ICD-10-CM | POA: Diagnosis not present

## 2023-03-25 DIAGNOSIS — Z79899 Other long term (current) drug therapy: Secondary | ICD-10-CM | POA: Diagnosis not present

## 2023-04-22 DIAGNOSIS — E559 Vitamin D deficiency, unspecified: Secondary | ICD-10-CM | POA: Diagnosis not present

## 2023-04-22 DIAGNOSIS — R030 Elevated blood-pressure reading, without diagnosis of hypertension: Secondary | ICD-10-CM | POA: Diagnosis not present

## 2023-04-22 DIAGNOSIS — F32A Depression, unspecified: Secondary | ICD-10-CM | POA: Diagnosis not present

## 2023-04-22 DIAGNOSIS — N914 Secondary oligomenorrhea: Secondary | ICD-10-CM | POA: Diagnosis not present

## 2023-04-22 DIAGNOSIS — E6609 Other obesity due to excess calories: Secondary | ICD-10-CM | POA: Diagnosis not present

## 2023-04-22 DIAGNOSIS — Z6836 Body mass index (BMI) 36.0-36.9, adult: Secondary | ICD-10-CM | POA: Diagnosis not present

## 2023-04-24 ENCOUNTER — Ambulatory Visit
Admission: EM | Admit: 2023-04-24 | Discharge: 2023-04-24 | Disposition: A | Discharge status: Home / Self Care | Payer: Medicaid Other

## 2023-04-24 ENCOUNTER — Encounter: Payer: Self-pay | Admitting: *Deleted

## 2023-04-24 ENCOUNTER — Other Ambulatory Visit: Payer: Self-pay

## 2023-04-24 DIAGNOSIS — J029 Acute pharyngitis, unspecified: Secondary | ICD-10-CM

## 2023-04-24 LAB — POCT INFLUENZA A/B
Influenza A, POC: NEGATIVE
Influenza B, POC: NEGATIVE

## 2023-04-24 LAB — POCT RAPID STREP A (OFFICE): Rapid Strep A Screen: NEGATIVE

## 2023-04-24 MED ORDER — DOXYCYCLINE HYCLATE 100 MG PO CAPS: 100.0000 mg | ORAL_CAPSULE | Freq: Two times a day (BID) | ORAL | 0 refills | Status: AC

## 2023-04-24 NOTE — ED Triage Notes (Signed)
 Pt reports sore throat since Friday, also states neck is sore to touch on front of neck. Denies fever. Her son has been sick

## 2023-04-25 ENCOUNTER — Encounter: Payer: Self-pay | Admitting: Physician Assistant

## 2023-04-25 NOTE — ED Provider Notes (Signed)
 [facility]    CSN: [id number] Arrival date & time: 04/24/23  1848      History   Chief Complaint Chief Complaint  Patient presents with   Sore Throat    HPI Katherine Hooper is a 33 y.o. female.   Patient presents today for evaluation of sore throat and soreness to the front of her neck. She has not had fever. She reports her son has also been sick. She does not report any cough or congestion.   The history is provided by the patient.  Sore Throat Pertinent negatives include no abdominal pain and no shortness of breath.    Past Medical History:  Diagnosis Date   Abnormal Pap smear    Anemia    during last pregnancy   Chlamydia    GERD (gastroesophageal reflux disease)    Group B streptococcal infection in pregnancy 2009   HPV (human papilloma virus) anogenital infection    Infection    BV x 2;currently on Flagyl  for BV   Scoliosis    Scoliosis     Patient Active Problem List   Diagnosis Date Noted   BMI 39.0-39.9,adult 09/11/2021   IUD (intrauterine device) in place 09/11/2021   Normal labor and delivery 02/26/2017   Nausea and vomiting in pregnancy prior to [redacted] weeks gestation 09/25/2016   Supervision of other normal pregnancy, antepartum 08/30/2016   GERD (gastroesophageal reflux disease) 02/13/2012   Allergy to penicillin 01/09/2012    Past Surgical History:  Procedure Laterality Date   NO PAST SURGERIES      OB History     Gravida  3   Para  3   Term  3   Preterm  0   AB  0   Living  3      SAB  0   IAB  0   Ectopic  0   Multiple  0   Live Births  3            Home Medications    Prior to Admission medications   Medication Sig Start Date End Date Taking? Authorizing Provider  doxycycline  (VIBRAMYCIN ) 100 MG capsule Take 1 capsule (100 mg total) by mouth 2 (two) times daily for 7 days. 04/24/23 05/01/23 Yes Billy Asberry FALCON, PA-C  levonorgestrel (MIRENA, 52 MG,) 20 MCG/DAY IUD 1 each by Intrauterine route once.    Yes [provider]  WEGOVY 0.5 MG/0.5ML SOAJ Inject 0.5 mg into the skin once a week. 03/26/23  Yes [provider]  cyclobenzaprine  (FLEXERIL ) 10 MG tablet Take 1 tablet (10 mg total) by mouth 2 (two) times daily as needed for muscle spasms. Patient not taking: Reported on 04/24/2023 12/30/22   Ruthell Lonni FALCON, PA-C  dicyclomine  (BENTYL ) 20 MG tablet Take 1 tablet (20 mg total) by mouth 2 (two) times daily. Patient not taking: Reported on 04/24/2023 04/06/18   Khatri, Hina, PA-C  famotidine  (PEPCID ) 20 MG tablet Take 1 tablet (20 mg total) by mouth 2 (two) times daily. Patient not taking: Reported on 04/24/2023 04/06/18   Leotis Sole, PA-C  fluconazole (DIFLUCAN) 200 MG tablet Take 200 mg by mouth once. Patient not taking: Reported on 04/24/2023 11/23/22   [provider]  ibuprofen  (ADVIL ,MOTRIN ) 600 MG tablet Take 1 tablet (600 mg total) by mouth every 6 (six) hours. Patient not taking: Reported on 04/24/2023 02/28/17   Phelps, Jazma Y, DO  metroNIDAZOLE  (FLAGYL ) 500 MG tablet Take 500 mg by mouth 2 (two) times daily. Patient not  taking: Reported on 04/24/2023 11/23/22   [provider]  metroNIDAZOLE  (METROGEL ) 0.75 % gel Apply 1 Application topically 2 (two) times daily. Patient not taking: Reported on 04/24/2023 11/23/22   [provider]  Olopatadine  HCl 0.2 % SOLN Apply 1 drop to eye daily. Patient not taking: Reported on 04/24/2023 02/12/18   Rolinda Rogue, MD  ondansetron  (ZOFRAN  ODT) 4 MG disintegrating tablet Take 1 tablet (4 mg total) by mouth every 8 (eight) hours as needed for nausea or vomiting. Patient not taking: Reported on 04/24/2023 04/06/18   Khatri, Hina, PA-C  phentermine 15 MG capsule Take 15 mg by mouth daily. Patient not taking: Reported on 04/24/2023 12/10/22   [provider]    Family History Family History  Problem Relation Age of Onset   Hypertension Maternal Grandmother    Diabetes Maternal Grandmother    Asthma Maternal  Grandmother    Kidney disease Maternal Grandmother        Dialysis   Hypertension Maternal Grandfather    Diabetes Maternal Grandfather    Stroke Maternal Grandfather    Hypertension Paternal Grandmother    Hypertension Paternal Grandfather    Asthma Cousin        Maternal   Other Neg Hx     Social History Social History   Tobacco Use   Smoking status: Former    Current packs/day: 0.00    Average packs/day: 0.3 packs/day for 10.0 years (2.5 ttl pk-yrs)    Types: Cigarettes    Start date: 09/16/2001    Quit date: 09/17/2011    Years since quitting: 11.6   Smokeless tobacco: Never  Vaping Use   Vaping status: Never Used  Substance Use Topics   Alcohol use: No   Drug use: Never     Allergies   Metronidazole  and Penicillins   Review of Systems Review of Systems  Constitutional:  Negative for chills and fever.  HENT:  Positive for sore throat. Negative for congestion and ear pain.   Eyes:  Negative for discharge and redness.  Respiratory:  Negative for cough, shortness of breath and wheezing.   Gastrointestinal:  Negative for abdominal pain, diarrhea, nausea and vomiting.     Physical Exam Triage Vital Signs ED Triage Vitals  Encounter Vitals Group     BP 04/24/23 1902 111/74     Systolic BP Percentile --      Diastolic BP Percentile --      Pulse Rate 04/24/23 1902 (!) 109     Resp 04/24/23 1902 20     Temp 04/24/23 1902 99.3 F (37.4 C)     Temp Source 04/24/23 1902 Oral     SpO2 04/24/23 1902 99 %     Weight --      Height --      Head Circumference --      Peak Flow --      Pain Score 04/24/23 1858 7     Pain Loc --      Pain Education --      Exclude from Growth Chart --    No data found.  Updated Vital Signs BP 111/74 (BP Location: Left Arm)   Pulse (!) 109   Temp 99.3 F (37.4 C) (Oral)   Resp 20   SpO2 99%   Breastfeeding No   Visual Acuity Right Eye Distance:   Left Eye Distance:   Bilateral Distance:    Right Eye Near:   Left Eye  Near:    Bilateral Near:  Physical Exam Vitals and nursing note reviewed.  Constitutional:      General: She is not in acute distress.    Appearance: Normal appearance. She is not ill-appearing.  HENT:     Head: Normocephalic and atraumatic.     Right Ear: Tympanic membrane normal.     Left Ear: Tympanic membrane normal.     Nose: No congestion or rhinorrhea.     Mouth/Throat:     Mouth: Mucous membranes are moist.     Pharynx: Posterior oropharyngeal erythema present. No oropharyngeal exudate.  Eyes:     Conjunctiva/sclera: Conjunctivae normal.  Cardiovascular:     Rate and Rhythm: Normal rate and regular rhythm.     Heart sounds: Normal heart sounds. No murmur heard. Pulmonary:     Effort: Pulmonary effort is normal. No respiratory distress.     Breath sounds: Normal breath sounds. No wheezing, rhonchi or rales.  Skin:    General: Skin is warm and dry.  Neurological:     Mental Status: She is alert.  Psychiatric:        Mood and Affect: Mood normal.        Thought Content: Thought content normal.      UC Treatments / Results  Labs (all labs ordered are listed, but only abnormal results are displayed) Labs Reviewed  POCT RAPID STREP A (OFFICE) - Normal  POCT INFLUENZA A/B - Normal    EKG   Radiology No results found.  Procedures Procedures (including critical care time)  Medications Ordered in UC Medications - No data to display  Initial Impression / Assessment and Plan / UC Course  I have reviewed the triage vital signs and the nursing notes.  Pertinent labs & imaging results that were available during my care of the patient were reviewed by me and considered in my medical decision making (see chart for details).    Rapid flu and strep screening negative. Given pharyngitis that has persisted and questionably worsening will treat with antibiotic. Advised follow up if no gradual improvement or with any further concerns.   Final Clinical Impressions(s)  / UC Diagnoses   Final diagnoses:  Acute pharyngitis, unspecified etiology   Discharge Instructions   None    ED Prescriptions     Medication Sig Dispense Auth. Provider   doxycycline  (VIBRAMYCIN ) 100 MG capsule Take 1 capsule (100 mg total) by mouth 2 (two) times daily for 7 days. 14 capsule Billy Asberry FALCON, PA-C      PDMP not reviewed this encounter.   Billy Asberry FALCON, PA-C 04/25/23 [phone]

## 2023-04-29 DIAGNOSIS — J111 Influenza due to unidentified influenza virus with other respiratory manifestations: Secondary | ICD-10-CM | POA: Diagnosis not present

## 2023-05-20 DIAGNOSIS — N914 Secondary oligomenorrhea: Secondary | ICD-10-CM | POA: Diagnosis not present

## 2023-05-20 DIAGNOSIS — F32A Depression, unspecified: Secondary | ICD-10-CM | POA: Diagnosis not present

## 2023-05-20 DIAGNOSIS — E559 Vitamin D deficiency, unspecified: Secondary | ICD-10-CM | POA: Diagnosis not present

## 2023-05-20 DIAGNOSIS — E6609 Other obesity due to excess calories: Secondary | ICD-10-CM | POA: Diagnosis not present

## 2023-05-20 DIAGNOSIS — Z6835 Body mass index (BMI) 35.0-35.9, adult: Secondary | ICD-10-CM | POA: Diagnosis not present

## 2023-06-03 DIAGNOSIS — L989 Disorder of the skin and subcutaneous tissue, unspecified: Secondary | ICD-10-CM | POA: Diagnosis not present

## 2023-06-03 DIAGNOSIS — B9689 Other specified bacterial agents as the cause of diseases classified elsewhere: Secondary | ICD-10-CM | POA: Diagnosis not present

## 2023-06-03 DIAGNOSIS — Z113 Encounter for screening for infections with a predominantly sexual mode of transmission: Secondary | ICD-10-CM | POA: Diagnosis not present

## 2023-06-03 DIAGNOSIS — N760 Acute vaginitis: Secondary | ICD-10-CM | POA: Diagnosis not present

## 2023-06-19 DIAGNOSIS — E559 Vitamin D deficiency, unspecified: Secondary | ICD-10-CM | POA: Diagnosis not present

## 2023-06-19 DIAGNOSIS — F3289 Other specified depressive episodes: Secondary | ICD-10-CM | POA: Diagnosis not present

## 2023-06-19 DIAGNOSIS — N914 Secondary oligomenorrhea: Secondary | ICD-10-CM | POA: Diagnosis not present

## 2023-06-19 DIAGNOSIS — Z6834 Body mass index (BMI) 34.0-34.9, adult: Secondary | ICD-10-CM | POA: Diagnosis not present

## 2023-06-19 DIAGNOSIS — E6609 Other obesity due to excess calories: Secondary | ICD-10-CM | POA: Diagnosis not present

## 2023-08-12 DIAGNOSIS — D539 Nutritional anemia, unspecified: Secondary | ICD-10-CM | POA: Diagnosis not present

## 2023-08-12 DIAGNOSIS — Z124 Encounter for screening for malignant neoplasm of cervix: Secondary | ICD-10-CM | POA: Diagnosis not present

## 2023-08-12 DIAGNOSIS — N914 Secondary oligomenorrhea: Secondary | ICD-10-CM | POA: Diagnosis not present

## 2023-08-12 DIAGNOSIS — E559 Vitamin D deficiency, unspecified: Secondary | ICD-10-CM | POA: Diagnosis not present

## 2023-08-12 DIAGNOSIS — F3289 Other specified depressive episodes: Secondary | ICD-10-CM | POA: Diagnosis not present

## 2023-08-12 DIAGNOSIS — R5383 Other fatigue: Secondary | ICD-10-CM | POA: Diagnosis not present

## 2023-08-12 DIAGNOSIS — E7800 Pure hypercholesterolemia, unspecified: Secondary | ICD-10-CM | POA: Diagnosis not present

## 2023-08-12 DIAGNOSIS — R110 Nausea: Secondary | ICD-10-CM | POA: Diagnosis not present

## 2023-08-12 DIAGNOSIS — E6609 Other obesity due to excess calories: Secondary | ICD-10-CM | POA: Diagnosis not present

## 2023-08-12 DIAGNOSIS — Z6834 Body mass index (BMI) 34.0-34.9, adult: Secondary | ICD-10-CM | POA: Diagnosis not present

## 2023-08-12 DIAGNOSIS — R0602 Shortness of breath: Secondary | ICD-10-CM | POA: Diagnosis not present

## 2023-09-16 DIAGNOSIS — F3289 Other specified depressive episodes: Secondary | ICD-10-CM | POA: Diagnosis not present

## 2023-09-16 DIAGNOSIS — Z6833 Body mass index (BMI) 33.0-33.9, adult: Secondary | ICD-10-CM | POA: Diagnosis not present

## 2023-09-16 DIAGNOSIS — E559 Vitamin D deficiency, unspecified: Secondary | ICD-10-CM | POA: Diagnosis not present

## 2023-09-16 DIAGNOSIS — E6609 Other obesity due to excess calories: Secondary | ICD-10-CM | POA: Diagnosis not present

## 2023-09-16 DIAGNOSIS — N914 Secondary oligomenorrhea: Secondary | ICD-10-CM | POA: Diagnosis not present

## 2023-10-21 DIAGNOSIS — F3289 Other specified depressive episodes: Secondary | ICD-10-CM | POA: Diagnosis not present

## 2023-10-21 DIAGNOSIS — N898 Other specified noninflammatory disorders of vagina: Secondary | ICD-10-CM | POA: Diagnosis not present

## 2023-10-21 DIAGNOSIS — Z113 Encounter for screening for infections with a predominantly sexual mode of transmission: Secondary | ICD-10-CM | POA: Diagnosis not present

## 2023-10-21 DIAGNOSIS — Z6832 Body mass index (BMI) 32.0-32.9, adult: Secondary | ICD-10-CM | POA: Diagnosis not present

## 2023-10-21 DIAGNOSIS — L989 Disorder of the skin and subcutaneous tissue, unspecified: Secondary | ICD-10-CM | POA: Diagnosis not present

## 2023-10-21 DIAGNOSIS — E6609 Other obesity due to excess calories: Secondary | ICD-10-CM | POA: Diagnosis not present

## 2023-10-21 DIAGNOSIS — N914 Secondary oligomenorrhea: Secondary | ICD-10-CM | POA: Diagnosis not present

## 2023-10-21 DIAGNOSIS — E559 Vitamin D deficiency, unspecified: Secondary | ICD-10-CM | POA: Diagnosis not present

## 2023-11-11 DIAGNOSIS — D2372 Other benign neoplasm of skin of left lower limb, including hip: Secondary | ICD-10-CM | POA: Diagnosis not present

## 2023-11-11 DIAGNOSIS — D485 Neoplasm of uncertain behavior of skin: Secondary | ICD-10-CM | POA: Diagnosis not present

## 2023-11-11 DIAGNOSIS — D2362 Other benign neoplasm of skin of left upper limb, including shoulder: Secondary | ICD-10-CM | POA: Diagnosis not present

## 2023-11-25 DIAGNOSIS — E6609 Other obesity due to excess calories: Secondary | ICD-10-CM | POA: Diagnosis not present

## 2023-11-25 DIAGNOSIS — N914 Secondary oligomenorrhea: Secondary | ICD-10-CM | POA: Diagnosis not present

## 2023-11-25 DIAGNOSIS — E559 Vitamin D deficiency, unspecified: Secondary | ICD-10-CM | POA: Diagnosis not present

## 2023-11-25 DIAGNOSIS — F3289 Other specified depressive episodes: Secondary | ICD-10-CM | POA: Diagnosis not present

## 2023-12-09 DIAGNOSIS — Z0000 Encounter for general adult medical examination without abnormal findings: Secondary | ICD-10-CM | POA: Diagnosis not present

## 2023-12-09 DIAGNOSIS — L989 Disorder of the skin and subcutaneous tissue, unspecified: Secondary | ICD-10-CM | POA: Diagnosis not present

## 2023-12-09 DIAGNOSIS — Z113 Encounter for screening for infections with a predominantly sexual mode of transmission: Secondary | ICD-10-CM | POA: Diagnosis not present

## 2024-01-07 DIAGNOSIS — F988 Other specified behavioral and emotional disorders with onset usually occurring in childhood and adolescence: Secondary | ICD-10-CM | POA: Diagnosis not present

## 2024-02-03 DIAGNOSIS — F988 Other specified behavioral and emotional disorders with onset usually occurring in childhood and adolescence: Secondary | ICD-10-CM | POA: Diagnosis not present

## 2024-02-16 ENCOUNTER — Encounter: Payer: Self-pay | Admitting: Emergency Medicine

## 2024-02-16 ENCOUNTER — Ambulatory Visit: Admission: EM | Admit: 2024-02-16 | Discharge: 2024-02-16 | Disposition: A | Discharge status: Home / Self Care

## 2024-02-16 DIAGNOSIS — J101 Influenza due to other identified influenza virus with other respiratory manifestations: Secondary | ICD-10-CM | POA: Diagnosis not present

## 2024-02-16 LAB — POC COVID19/FLU A&B COMBO
Covid Antigen, POC: NEGATIVE
Influenza A Antigen, POC: NEGATIVE
Influenza B Antigen, POC: POSITIVE — AB

## 2024-02-16 MED ORDER — BENZONATATE 100 MG PO CAPS: 100.0000 mg | ORAL_CAPSULE | Freq: Three times a day (TID) | ORAL | 0 refills | Status: AC

## 2024-02-16 MED ORDER — OSELTAMIVIR PHOSPHATE 75 MG PO CAPS: 75.0000 mg | ORAL_CAPSULE | Freq: Two times a day (BID) | ORAL | 0 refills | Status: AC

## 2024-02-16 NOTE — ED Provider Notes (Signed)
 [facility]    CSN: [id number] Arrival date & time: 02/16/24  1236      History   Chief Complaint Chief Complaint  Patient presents with   Nasal Congestion   Fever    HPI Katherine Hooper is a 33 y.o. female.   Pt today due to 3 days worth of nasal congestion, cough productive of yellow mucus, fatigue, and subjective fever.  Patient states that she is taking Mucinex and Goody powder for symptoms without significant relief.  The history is provided by the patient.  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   Strain of calf muscle 01/27/2023   Pain of left lower leg 12/31/2022   BMI 39.0-39.9,adult 09/11/2021   IUD (intrauterine device) in place 09/11/2021   Class 1 obesity due to excess calories without serious comorbidity with body mass index (BMI) of 32.0 to 32.9 in adult 09/11/2021   Normal labor and delivery 02/26/2017   Nausea and vomiting in pregnancy prior to [redacted] weeks gestation 09/25/2016   Supervision of other normal pregnancy, antepartum 08/30/2016   GERD (gastroesophageal reflux disease) 02/13/2012   Allergy to penicillin 01/09/2012    Past Surgical History:  Procedure Laterality Date   NO PAST SURGERIES      OB History     Gravida  3   Para  3   Term  3   Preterm  0   AB  0   Living  3      SAB  0   IAB  0   Ectopic  0   Multiple  0   Live Births  3            Home Medications    Prior to Admission medications   Medication Sig Start Date End Date Taking? Authorizing Provider   amphetamine-dextroamphetamine (ADDERALL XR) 10 MG 24 hr capsule Take by mouth. 01/08/24  Yes [provider]  amphetamine-dextroamphetamine (ADDERALL XR) 20 MG 24 hr capsule Take 20 mg by mouth. 02/03/24 03/04/25 Yes [provider]  benzonatate (TESSALON) 100 MG capsule Take 1 capsule (100 mg total) by mouth every 8 (eight) hours. 02/16/24  Yes Andra Corean BROCKS, PA-C  buPROPion (WELLBUTRIN XL) 150 MG 24 hr tablet Take 150 mg by mouth. 01/22/24  Yes [provider]  oseltamivir (TAMIFLU) 75 MG capsule Take 1 capsule (75 mg total) by mouth every 12 (twelve) hours. 02/16/24  Yes Andra Corean C, PA-C  Vitamin D , Ergocalciferol , (DRISDOL) 1.25 MG (50000 UNIT) CAPS capsule Take by mouth. 01/14/23  Yes [provider]  cyclobenzaprine  (FLEXERIL ) 10 MG tablet Take 1 tablet (10 mg total) by mouth 2 (two) times daily as needed for muscle spasms. Patient not taking: Reported on 04/24/2023 12/30/22   Ruthell Lonni FALCON, PA-C  dicyclomine  (BENTYL ) 20 MG tablet Take 1 tablet (20 mg total) by mouth 2 (two) times daily. Patient not taking: Reported on 04/24/2023 04/06/18   Khatri, Hina, PA-C  famotidine  (PEPCID ) 20 MG tablet Take  1 tablet (20 mg total) by mouth 2 (two) times daily. Patient not taking: Reported on 04/24/2023 04/06/18   Leotis Sole, PA-C  fluconazole (DIFLUCAN) 200 MG tablet Take 200 mg by mouth once. Patient not taking: Reported on 04/24/2023 11/23/22   [provider]  ibuprofen  (ADVIL ,MOTRIN ) 600 MG tablet Take 1 tablet (600 mg total) by mouth every 6 (six) hours. Patient not taking: Reported on 04/24/2023 02/28/17   Phelps, Jazma Y, DO  levonorgestrel (MIRENA, 52 MG,) 20 MCG/DAY IUD 1 each by Intrauterine route once.    [provider]  metroNIDAZOLE  (FLAGYL ) 500 MG tablet Take 500 mg by mouth 2 (two) times daily. Patient not taking: Reported on 04/24/2023 11/23/22   [provider]  metroNIDAZOLE  (METROGEL ) 0.75 % gel Apply 1  Application topically 2 (two) times daily. Patient not taking: Reported on 04/24/2023 11/23/22   [provider]  Olopatadine  HCl 0.2 % SOLN Apply 1 drop to eye daily. Patient not taking: Reported on 04/24/2023 02/12/18   Rolinda Rogue, MD  ondansetron  (ZOFRAN  ODT) 4 MG disintegrating tablet Take 1 tablet (4 mg total) by mouth every 8 (eight) hours as needed for nausea or vomiting. Patient not taking: Reported on 04/24/2023 04/06/18   Khatri, Hina, PA-C  phentermine 15 MG capsule Take 15 mg by mouth daily. Patient not taking: Reported on 04/24/2023 12/10/22   [provider]  triamcinolone ointment (KENALOG) 0.5 % APPLY TOPICALLY 2 TIMES DAILY FOR 30 DAYS.    [provider]  WEGOVY 0.5 MG/0.5ML SOAJ Inject 0.5 mg into the skin once a week. 03/26/23   [provider]    Family History Family History  Problem Relation Age of Onset   Hypertension Maternal Grandmother    Diabetes Maternal Grandmother    Asthma Maternal Grandmother    Kidney disease Maternal Grandmother        Dialysis   Hypertension Maternal Grandfather    Diabetes Maternal Grandfather    Stroke Maternal Grandfather    Hypertension Paternal Grandmother    Hypertension Paternal Grandfather    Asthma Cousin        Maternal   Other Neg Hx     Social History Social History   Tobacco Use   Smoking status: Former    Current packs/day: 0.00    Average packs/day: 0.3 packs/day for 10.0 years (2.5 ttl pk-yrs)    Types: Cigarettes    Start date: 09/16/2001    Quit date: 09/17/2011    Years since quitting: 12.4    Passive exposure: Past   Smokeless tobacco: Never  Vaping Use   Vaping status: Never Used  Substance Use Topics   Alcohol use: No   Drug use: Never     Allergies   Metronidazole  and Penicillins   Review of Systems Review of Systems  Constitutional:  Positive for fever.     Physical Exam Triage Vital Signs ED Triage Vitals  Encounter Vitals Group     BP 02/16/24 1410  120/75     Girls Systolic BP Percentile --      Girls Diastolic BP Percentile --      Boys Systolic BP Percentile --      Boys Diastolic BP Percentile --      Pulse Rate 02/16/24 1410 (!) 105     Resp 02/16/24 1410 18     Temp 02/16/24 1410 99.8 F (37.7 C)     Temp Source 02/16/24 1410 Oral     SpO2 02/16/24 1410 97 %  Weight 02/16/24 1409 163 lb (73.9 kg)     Height --      Head Circumference --      Peak Flow --      Pain Score 02/16/24 1408 4     Pain Loc --      Pain Education --      Exclude from Growth Chart --    No data found.  Updated Vital Signs BP 120/75 (BP Location: Left Arm)   Pulse (!) 105   Temp 99.8 F (37.7 C) (Oral)   Resp 18   Wt 163 lb (73.9 kg)   LMP 02/05/2024 (Exact Date)   SpO2 97%   BMI 29.81 kg/m   Visual Acuity Right Eye Distance:   Left Eye Distance:   Bilateral Distance:    Right Eye Near:   Left Eye Near:    Bilateral Near:     Physical Exam Vitals and nursing note reviewed.  Constitutional:      General: She is not in acute distress.    Appearance: Normal appearance. She is ill-appearing. She is not toxic-appearing or diaphoretic.  HENT:     Nose: Congestion (moderately enlarged turbinates) present. No rhinorrhea.     Mouth/Throat:     Mouth: Mucous membranes are moist.     Pharynx: Oropharynx is clear. No oropharyngeal exudate or posterior oropharyngeal erythema.  Eyes:     General: No scleral icterus. Cardiovascular:     Rate and Rhythm: Normal rate and regular rhythm.     Heart sounds: Normal heart sounds.  Pulmonary:     Effort: Pulmonary effort is normal. No respiratory distress.     Breath sounds: Normal breath sounds. No wheezing or rhonchi.  Skin:    General: Skin is warm.  Neurological:     Mental Status: She is alert and oriented to person, place, and time.  Psychiatric:        Mood and Affect: Mood normal.        Behavior: Behavior normal.      UC Treatments / Results  Labs (all labs ordered are  listed, but only abnormal results are displayed) Labs Reviewed  POC COVID19/FLU A&B COMBO - Abnormal; Notable for the following components:      Result Value   Influenza B Antigen, POC Positive (*)    All other components within normal limits    EKG   Radiology No results found.  Procedures Procedures (including critical care time)  Medications Ordered in UC Medications - No data to display  Initial Impression / Assessment and Plan / UC Course  I have reviewed the triage vital signs and the nursing notes.  Pertinent labs & imaging results that were available during my care of the patient were reviewed by me and considered in my medical decision making (see chart for details).    Final Clinical Impressions(s) / UC Diagnoses   Final diagnoses:  Influenza B     Discharge Instructions      You been diagnosed with a viral illness today. -Viruses have to run their course and medicines that are prescribed are meant to help with symptoms. - With viruses usually feel poorly from 3 to 7 days with cough being the last symptoms to resolve.  -Cough can linger from days to weeks.  Antibiotics are not effective for viruses. -If your cough lasts more than 2 weeks and you are coughing so hard that you are vomiting or feel like you could pass out we need to follow-up  with PCP for further testing and evaluation. -Rest, increase water intake, may use pseudoephedrine for nasal congestion, Delsym (dextromethorphan) or honey as needed for cough, and ibuprofen  and/or Tylenol  as directed on packaging for pain and fever. -If you have hypertension you should take Coricidin or other OTC meds approved for people with high blood pressure. -You may use a spoonful of honey every 4-6 hours as needed for throat pain and cough. -Warm tea with honey and lemon are helpful for soothe throat as well.  Chloraseptic and Cepacol make a throat lozenge with numbing medication, can be purchased  over-the-counter. -May also use Flonase or sinus rinse for sinus pressure or nasal congestion.  Be sure to use distilled bottled water for sinus rinses. -May use coolmist humidifier to open up nasal passages -May elevate head to assist with postnasal drainage. -If you feel poorly (fever, fatigue, shortness of breath, nausea, etc.) for more than 10 days to be sure to follow-up with PCP or in clinic for further evaluation and additional treatments. If you experience chest pain with shortness of breath or pulse oxygen less than 95% you should report to the ER.     ED Prescriptions     Medication Sig Dispense Auth. Provider   oseltamivir  (TAMIFLU ) 75 MG capsule Take 1 capsule (75 mg total) by mouth every 12 (twelve) hours. 10 capsule Andra Krabbe C, PA-C   benzonatate  (TESSALON ) 100 MG capsule Take 1 capsule (100 mg total) by mouth every 8 (eight) hours. 30 capsule Andra Krabbe BROCKS, PA-C      PDMP not reviewed this encounter.   Andra Krabbe BROCKS, PA-C 02/16/24 1436

## 2024-02-16 NOTE — Discharge Instructions (Signed)

## 2024-02-16 NOTE — ED Triage Notes (Signed)
 Pt presents c/o URI x 3 days. Pt states,  It started off with just nasal congestion. Then, last night I went bar hopping which I think made it worse. I have a barky cough and I am coughing up yellowish mucus. I also had a fever when I got home last night so I took some meds for it. I've tried a Goody powder and Mucinex thus far.  Pt denies emesis but does c/o diarrhea.
